# Patient Record
Sex: Female | Born: 1990
Health system: Southern US, Community
[De-identification: ages and names within clinical notes are randomized; demographics above are authoritative.]

## PROBLEM LIST (undated history)

## (undated) ENCOUNTER — Inpatient Hospital Stay (HOSPITAL_COMMUNITY): Payer: Self-pay

## (undated) DIAGNOSIS — L309 Dermatitis, unspecified: Secondary | ICD-10-CM

## (undated) HISTORY — DX: Dermatitis, unspecified: L30.9

## (undated) HISTORY — PX: NO PAST SURGERIES: SHX2092

---
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
## 2011-05-19 LAB — STREP A DNA PROBE: Group A Strep Probe: NEGATIVE

## 2011-05-19 LAB — RAPID STREP SCREEN (MED CTR MEBANE ONLY): Streptococcus, Group A Screen (Direct): NEGATIVE

## 2012-08-15 ENCOUNTER — Emergency Department (HOSPITAL_COMMUNITY)
Admission: EM | Admit: 2012-08-15 | Discharge: 2012-08-15 | Disposition: A | Discharge status: Home / Self Care | Payer: BC Managed Care – PPO | Attending: Emergency Medicine | Admitting: Emergency Medicine

## 2012-08-15 ENCOUNTER — Encounter (HOSPITAL_COMMUNITY): Payer: Self-pay | Admitting: Family Medicine

## 2012-08-15 DIAGNOSIS — Z3490 Encounter for supervision of normal pregnancy, unspecified, unspecified trimester: Secondary | ICD-10-CM

## 2012-08-15 DIAGNOSIS — M549 Dorsalgia, unspecified: Secondary | ICD-10-CM | POA: Insufficient documentation

## 2012-08-15 DIAGNOSIS — Z3201 Encounter for pregnancy test, result positive: Secondary | ICD-10-CM | POA: Insufficient documentation

## 2012-08-15 LAB — URINALYSIS, ROUTINE W REFLEX MICROSCOPIC
Bilirubin Urine: NEGATIVE
Glucose, UA: NEGATIVE mg/dL
Hgb urine dipstick: NEGATIVE
Ketones, ur: NEGATIVE mg/dL
Nitrite: NEGATIVE
Protein, ur: NEGATIVE mg/dL
Specific Gravity, Urine: 1.02 (ref 1.005–1.030)
Urobilinogen, UA: 4 mg/dL — ABNORMAL HIGH (ref 0.0–1.0)
pH: 6.5 (ref 5.0–8.0)

## 2012-08-15 LAB — POCT PREGNANCY, URINE: Preg Test, Ur: POSITIVE — AB

## 2012-08-15 NOTE — ED Provider Notes (Signed)
History    This chart was scribed for [employer]. Rubin Payor, MD, MD by Smitty Pluck, ED Scribe. The patient was seen in room [facility] and the patient's care was started at 6:39PM.   CSN: [id number]  Arrival date & time 08/15/12  1549        Chief Complaint  Patient presents with  . Possible Pregnancy    (Consider location/radiation/quality/duration/timing/severity/associated sxs/prior treatment) The history is provided by the patient. No language interpreter was used.   Shelia Martin is a 21 y.o. female who presents to the Emergency Department due to missing her period this month. Pt reports that she had period in November and it was only 3 days and abnormally light. She states she has not been sexually active since November. She reports that she has generalized back pain and breast tenderness. She states she has had 2 positive pregnancy tests at home. She denies vaginal bleeding, vaginal discharge, nausea, abdominal pain and any other symptoms.   History reviewed. No pertinent past medical history.  History reviewed. No pertinent past surgical history.  History reviewed. No pertinent family history.  History  Substance Use Topics  . Smoking status: Never Smoker   . Smokeless tobacco: Not on file  . Alcohol Use: No    OB History    Grav Para Term Preterm Abortions TAB SAB Ect Mult Living                  Review of Systems  Constitutional: Negative for fever and chills.  Respiratory: Negative for shortness of breath.   Gastrointestinal: Negative for nausea, vomiting and abdominal pain.  Musculoskeletal: Positive for back pain.  Neurological: Negative for weakness.  All other systems reviewed and are negative.    Allergies  Review of patient's allergies indicates no known allergies.  Home Medications   Current Outpatient Rx  Name  Route  Sig  Dispense  Refill  . HYDROCORTISONE 1 % EX CREA   Topical   Apply 1 application topically 2 (two) times daily as needed.  For itiching           BP 129/65  Pulse 85  Temp 98.7 F (37.1 C)  Resp 18  SpO2 97%  LMP 06/22/2012  Physical Exam  Nursing note and vitals reviewed. Constitutional: She is oriented to person, place, and time. She appears well-developed and well-nourished. No distress.  HENT:  Head: Normocephalic and atraumatic.  Eyes: EOM are normal.  Neck: Neck supple. No tracheal deviation present.  Cardiovascular: Normal rate, regular rhythm and normal heart sounds.   Pulmonary/Chest: Effort normal and breath sounds normal. No respiratory distress.  Abdominal: Soft. She exhibits no distension. There is no tenderness. There is no rebound and no guarding.  Musculoskeletal: Normal range of motion.  Neurological: She is alert and oriented to person, place, and time.  Skin: Skin is warm and dry.  Psychiatric: She has a normal mood and affect. Her behavior is normal.    ED Course  Procedures (including critical care time)   COORDINATION OF CARE: 6:42 PM Discussed ED treatment with pt     Results for orders placed during the hospital encounter of 08/15/12  URINALYSIS, ROUTINE W REFLEX MICROSCOPIC      Component Value Range   Color, Urine YELLOW  YELLOW   APPearance CLOUDY (*) CLEAR   Specific Gravity, Urine 1.020  1.005 - 1.030   pH 6.5  5.0 - 8.0   Glucose, UA NEGATIVE  NEGATIVE mg/dL   Hgb urine  dipstick NEGATIVE  NEGATIVE   Bilirubin Urine NEGATIVE  NEGATIVE   Ketones, ur NEGATIVE  NEGATIVE mg/dL   Protein, ur NEGATIVE  NEGATIVE mg/dL   Urobilinogen, UA 4.0 (*) 0.0 - 1.0 mg/dL   Nitrite NEGATIVE  NEGATIVE   Leukocytes, UA TRACE (*) NEGATIVE  POCT PREGNANCY, URINE      Component Value Range   Preg Test, Ur POSITIVE (*) NEGATIVE  URINE MICROSCOPIC-ADD ON      Component Value Range   Squamous Epithelial / LPF RARE  RARE   WBC, UA 0-2  <3 WBC/hpf   RBC / HPF 0-2  <3 RBC/hpf      No results found.   1. Pregnant       MDM  Patient presents after missing a period  in December. She states her period in November was light. She has a positive pregnancy test here. She has had some progress also. She's had no abdominal pain. I doubt ectopic at this time, however is not ruled out. She'll followup with [facility].      I personally performed the services described in this documentation, which was scribed in my presence. The recorded information has been reviewed and is accurate.     Juliet Rude. Rubin Payor, MD 08/15/12 [phone]

## 2012-08-15 NOTE — ED Notes (Signed)
Per pt sts missed period this month and 2 positive pregnancy tests at home

## 2012-08-18 NOTE — L&D Delivery Note (Signed)
Attestation of Attending Supervision of Advanced Practitioner (CNM/NP): Evaluation and management procedures were performed by the Advanced Practitioner under my supervision and collaboration. I have reviewed the Advanced Practitioner's note and chart, and I agree with the management and plan.  Senon Nixon H. 7:57 AM   

## 2012-08-18 NOTE — L&D Delivery Note (Signed)
Delivery Note At 6:30 AM a viable female was delivered via  (Presentation: LOA), terminal meconium noted.  Infant placed directly on mom's abdomen for bonding/skin to skin. Cord allowed to stop pulsing, and was clamped x 2, and cut by FOB.  APGAR: pending at time of note. Infant w/ spontaneous cry and respiration at birth; weight: not available at time of note, but appears SGA.   Placenta status: Intact, Spontaneous, small.  Cord:  with the following complications: none.   Received 1 dose of ampicillin for gbs pos just prior to birth  Anesthesia:  Epidural Episiotomy: n/a Lacerations: intact Suture Repair: n/a Est. Blood Loss (mL): 200  Mom to postpartum.  Baby to nursery-stable. Placenta to pathology d/t SGA, and small placenta.  Plans to breastfeed, undecided about contraception.   Marge Duncans 04/03/2013, 6:49 AM

## 2012-09-06 ENCOUNTER — Ambulatory Visit: Payer: BC Managed Care – PPO | Admitting: Family Medicine

## 2012-09-17 ENCOUNTER — Ambulatory Visit (INDEPENDENT_AMBULATORY_CARE_PROVIDER_SITE_OTHER): Payer: BC Managed Care – PPO | Admitting: Family Medicine

## 2012-09-17 ENCOUNTER — Encounter: Payer: Self-pay | Admitting: Family Medicine

## 2012-09-17 VITALS — BP 116/70 | HR 70 | Resp 18 | Ht 66.0 in | Wt 134.1 lb

## 2012-09-17 DIAGNOSIS — Z3201 Encounter for pregnancy test, result positive: Secondary | ICD-10-CM

## 2012-09-17 NOTE — Patient Instructions (Signed)
Ultrasound to be done for pregnancy dating - [facility] Keep same date with women's health clinic  Continue multivitamin  I recommend Flu shot/ make sure you get at appointment if not before  F/U as needed

## 2012-09-17 NOTE — Progress Notes (Signed)
  Subjective:    Patient ID: Shelia Martin, female    DOB: May 11, 1991, 22 y.o.   MRN: [id number]  HPI  Patient here to establish care. [facility] 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 She found out she was pregnant a few weeks ago while being seen at the hospital for upper respiratory symptoms. She believes her last period was in November but she has not sure the specific day. She is accompanied today by the baby's father. She's not had an appointment with OB/GYN she called her insurance and was told that she was not cover for prenatal care she is apply for Medicaid and was given a tentative appointment for the women's health clinic in [place] for February 18. She is adopted however she does know that Huntington's disease does run in her family. She is taking prenatal vitamins  Review of Systems  GEN- denies fatigue, fever, weight loss,weakness, recent illness HEENT- denies eye drainage, change in vision, nasal discharge, CVS- denies chest pain, palpitations RESP- denies SOB, cough, wheeze ABD- denies N/V, change in stools, abd pain GU- denies dysuria, hematuria, dribbling, incontinence MSK- denies joint pain, muscle aches, injury Neuro- denies headache, dizziness, syncope, seizure activity      Objective:   Physical Exam GEN- NAD, alert and oriented x3, well groomed  HEENT- PERRL, EOMI, non injected sclera, pink conjunctiva, MMM, oropharynx clear Neck- Supple, no thryomegaly CVS- RRR, no murmur RESP-CTAB ABS-NABS,soft,NT,ND EXT- No edema Pulses- Radial, DP- 2+ Psych-normal affect and mood       Assessment & Plan:

## 2012-09-19 NOTE — Assessment & Plan Note (Addendum)
Specific date of LMP unknown, pt single , Father of child here today but it appears that are not on good standing, will set her up for dating ultrasound, discussed prenatal vitamins and water intake which she is doing, Keep appt with [facility]  Will need flu shot Defer labs to her OB

## 2012-09-22 ENCOUNTER — Other Ambulatory Visit (HOSPITAL_COMMUNITY): Payer: BC Managed Care – PPO

## 2012-09-22 ENCOUNTER — Ambulatory Visit (HOSPITAL_COMMUNITY)
Admission: RE | Admit: 2012-09-22 | Discharge: 2012-09-22 | Disposition: A | Discharge status: Home / Self Care | Payer: BC Managed Care – PPO | Source: Ambulatory Visit | Attending: Family Medicine | Admitting: Family Medicine

## 2012-09-22 DIAGNOSIS — Z3201 Encounter for pregnancy test, result positive: Secondary | ICD-10-CM

## 2012-09-22 DIAGNOSIS — Z3689 Encounter for other specified antenatal screening: Secondary | ICD-10-CM | POA: Insufficient documentation

## 2012-09-23 ENCOUNTER — Other Ambulatory Visit: Payer: Self-pay | Admitting: Family Medicine

## 2012-09-23 MED ORDER — HYDROCORTISONE 1 % EX CREA: 1.0000 "application " | TOPICAL_CREAM | Freq: Two times a day (BID) | CUTANEOUS | Status: DC | PRN

## 2012-10-05 ENCOUNTER — Other Ambulatory Visit: Payer: Self-pay | Admitting: Obstetrics & Gynecology

## 2012-10-05 ENCOUNTER — Encounter: Payer: Self-pay | Admitting: Obstetrics & Gynecology

## 2012-10-05 ENCOUNTER — Ambulatory Visit (INDEPENDENT_AMBULATORY_CARE_PROVIDER_SITE_OTHER): Payer: BC Managed Care – PPO | Admitting: Obstetrics & Gynecology

## 2012-10-05 VITALS — BP 93/62 | Temp 97.1°F | Wt 132.2 lb

## 2012-10-05 DIAGNOSIS — Z3400 Encounter for supervision of normal first pregnancy, unspecified trimester: Secondary | ICD-10-CM

## 2012-10-05 DIAGNOSIS — Z23 Encounter for immunization: Secondary | ICD-10-CM

## 2012-10-05 LAB — POCT URINALYSIS DIP (DEVICE)
Bilirubin Urine: NEGATIVE
Glucose, UA: NEGATIVE mg/dL
Hgb urine dipstick: NEGATIVE
Ketones, ur: NEGATIVE mg/dL
Leukocytes, UA: NEGATIVE
Nitrite: NEGATIVE

## 2012-10-05 LAB — OB RESULTS CONSOLE GBS: GBS: POSITIVE

## 2012-10-05 LAB — HIV ANTIBODY (ROUTINE TESTING W REFLEX): HIV: NONREACTIVE

## 2012-10-05 MED ORDER — INFLUENZA VIRUS VACC SPLIT PF IM SUSP
0.5000 mL | Freq: Once | INTRAMUSCULAR | Status: AC
  Administered 2012-10-05: 0.5 mL via INTRAMUSCULAR

## 2012-10-05 NOTE — Patient Instructions (Addendum)
Prenatal Care   WHAT IS PRENATAL CARE?   Prenatal care means health care during your pregnancy, before your baby is born. Take care of yourself and your baby by:   · Getting early prenatal care. If you know you are pregnant, or think you might be pregnant, call your caregiver as soon as possible. Schedule a visit for a general/prenatal examination.  · Getting regular prenatal care. Follow your caregiver's schedule for blood and other necessary tests. Do not miss appointments.  · Do everything you can to keep yourself and your baby healthy during your pregnancy.  · Prenatal care should include evaluation of medical, dietary, educational, psychological, and social needs for the couple and the medical, surgical, and genetic history of the family of the mother and father.  · Discuss with your caregiver:  · Your medicines, prescription, over-the-counter, and herbal medicines.  · Substance abuse, alcohol, smoking, and illegal drugs.  · Domestic abuse and violence, if present.  · Your immunizations.  · Nutrition and diet.  · Exercising.  · Environment and occupational hazards, at home and at work.  · History of sexually transmitted disease, both you and your partner.  · Previous pregnancies.  WHY IS PRENATAL CARE SO IMPORTANT?   By seeing you regularly, your caregiver has the chance to find problems early, so that they can be treated as soon as possible. Other problems might be prevented. Many studies have shown that early and regular prenatal care is important for the health of both mothers and their babies.   I AM THINKING ABOUT GETTING PREGNANT. HOW CAN I TAKE CARE OF MYSELF?   Taking care of yourself before you get pregnant helps you to have a healthy pregnancy. It also lowers your chances of having a baby born with a birth defect. Here are ways to take care of yourself before you get pregnant:   · Eat healthy foods, exercise regularly (30 minutes per day for most days of the week is best), and get enough rest and  sleep. Talk to your caregiver about what kinds of foods and exercises are best for you.  · Take 400 micrograms (mcg) of folic acid (one of the B vitamins) every day. The best way to do this is to take a daily multivitamin pill that contains this amount of folic acid. Getting enough of the synthetic (manufactured) form of folic acid every day before you get pregnant and during early pregnancy can help prevent certain birth defects. Many breakfast cereals and other grain products have folic acid added to them, but only certain cereals contain 400 mcg of folic acid per serving. Check the label on your multivitamin or cereal to find the amount of folic acid in the food.  · See your caregiver for a complete check up before getting pregnant. Make sure that you have had all your immunization shots, especially for rubella (German measles). Rubella can cause serious birth defects. Chickenpox is another illness you want to avoid during pregnancy. If you have had chickenpox and rubella in the past, you should be immune to them.  · Tell your caregiver about any prescription or non-prescription medicines (including herbal remedies) you are taking. Some medicines are not safe to take during pregnancy.  · Stop smoking cigarettes, drinking alcohol, or taking illegal drugs. Ask your caregiver for help, if you need it. You can also get help with alcohol and drugs by talking with a member of your faith community, a counselor, or a trusted friend.  · Discuss   and treat any medical, social, or psychological problems before getting pregnant.  · Discuss any history of genetic problems in the mother, father, and their families. Do genetic testing before getting pregnant, when possible.  · Discuss any physical or emotional abuse with your caregiver.  · Discuss with your caregiver if you might be exposed to harmful chemicals on your job or where you live.  · Discuss with your caregiver if you think your job or the hours you work may be  harmful and should be changed.  · The father should be involved with the decision making and with all aspects of the pregnancy, labor, and delivery.  · If you have medical insurance, make sure you are covered for pregnancy.  I JUST FOUND OUT THAT I AM PREGNANT. HOW CAN I TAKE CARE OF MYSELF?   Here are ways to take care of yourself and the precious new life growing inside you:   · Continue taking your multivitamin with 400 micrograms (mcg) of folic acid every day.  · Get early and regular prenatal care. It does not matter if this is your first pregnancy or if you already have children. It is very important to see a caregiver during your pregnancy. Your caregiver will check at each visit to make sure that you and the baby are healthy. If there are any problems, action can be taken right away to help you and the baby.  · Eat a healthy diet that includes:  · Fruits.  · Vegetables.  · Foods low in saturated fat.  · Grains.  · Calcium-rich foods.  · Drink 6 to 8 glasses of liquids a day.  · Unless your caregiver tells you not to, try to be physically active for 30 minutes, most days of the week. If you are pressed for time, you can get your activity in through 10 minute segments, three times a day.  · If you smoke, drink alcohol, or use drugs, STOP. These can cause long-term damage to your baby. Talk with your caregiver about steps to take to stop smoking. Talk with a member of your faith community, a counselor, a trusted friend, or your caregiver if you are concerned about your alcohol or drug use.  · Ask your caregiver before taking any medicine, even over-the-counter medicines. Some medicines are not safe to take during pregnancy.  · Get plenty of rest and sleep.  · Avoid hot tubs and saunas during pregnancy.  · Do not have X-rays taken, unless absolutely necessary and with the recommendation of your caregiver. A lead shield can be placed on your abdomen, to protect the baby when X-rays are taken in other parts of the  body.  · Do not empty the cat litter when you are pregnant. It may contain a parasite that causes an infection called toxoplasmosis, which can cause birth defects. Also, use gloves when working in garden areas used by cats.  · Do not eat uncooked or undercooked cheese, meats, or fish.  · Stay away from toxic chemicals like:  · Insecticides.  · Solvents (some cleaners or paint thinners).  · Lead.  · Mercury.  · Sexual relations may continue until the end of the pregnancy, unless you have a medical problem or there is a problem with the pregnancy and your caregiver tells you not to.  · Do not wear high heel shoes, especially during the second half of the pregnancy. You can lose your balance and fall.  · Do not take long trips, unless   absolutely necessary. Be sure to see your caregiver before going on the trip.  · Do not sit in one position for more than 2 hours, when on a trip.  · Take a copy of your medical records when going on a trip.  · Know where there is a hospital in the city you are visiting, in case of an emergency.  · Most dangerous household products will have pregnancy warnings on their labels. Ask your caregiver about products if you are unsure.  · Limit or eliminate your caffeine intake from coffee, tea, sodas, medicines, and chocolate.  · Many women continue working through pregnancy. Staying active might help you stay healthier. If you have a question about the safety or the hours you work at your particular job, talk with your caregiver.  · Get informed:  · Read books.  · Watch videos.  · Go to childbirth classes for you and the father.  · Talk with experienced moms.  · Ask your caregiver about childbirth education classes for you and your partner. Classes can help you and your partner prepare for the birth of your baby.  · Ask about a pediatrician (baby doctor) and methods and pain medicine for labor, delivery, and possible Cesarean delivery (C-section).  I AM NOT THINKING ABOUT GETTING PREGNANT  RIGHT NOW, BUT HEARD THAT ALL WOMEN SHOULD TAKE FOLIC ACID EVERY DAY?   All women of childbearing age, with even a remote chance of getting pregnant, should try to make sure they get enough folic acid. Many pregnancies are not planned. Many women do not know they are actually pregnant early in their pregnancies, and certain birth defects happen in the very early part of pregnancy. Taking 400 micrograms (mcg) of folic acid every day will help prevent certain birth defects that happen in the early part of pregnancy. If a woman begins taking vitamin pills in the second or third month of pregnancy, it may be too late to prevent birth defects. Folic acid may also have other health benefits for women, besides preventing birth defects.   HOW OFTEN SHOULD I SEE MY CAREGIVER DURING PREGNANCY?   Your caregiver will give you a schedule for your prenatal visits. You will have visits more often as you get closer to the end of your pregnancy. An average pregnancy lasts about 40 weeks.   A typical schedule includes visiting your caregiver:   · About once each month, during your first 6 months of pregnancy.  · Every 2 weeks, during the next 2 months.  · Weekly in the last month, until the delivery date.  Your caregiver will probably want to see you more often if:  · You are over 35.  · Your pregnancy is high risk, because you have certain health problems or problems with the pregnancy, such as:  · Diabetes.  · High blood pressure.  · The baby is not growing on schedule, according to the dates of the pregnancy.  Your caregiver will do special tests, to make sure you and the baby are not having any serious problems.  WHAT HAPPENS DURING PRENATAL VISITS?   · At your first prenatal visit, your caregiver will talk to you about you and your partner's health history and your family's health history, and will do a physical exam.  · On your first visit, a physical exam will include checks of your blood pressure, height and weight, and an  exam of your pelvic organs. Your caregiver will do a Pap test if you have   not had one recently, and will do cultures of your cervix to make sure there is no infection.  · At each visit, there will be tests of your blood, urine, blood pressure, weight, and checking the progress of the baby.  · Your caregiver will be able to tell you when to expect that your baby will be born.  · Each visit is also a chance for you to learn about staying healthy during pregnancy and for asking questions.  · Discuss whether you will be breastfeeding.  · At your later prenatal visits, your caregiver will check how you are doing and how the baby is developing. You may have a number of tests done as your pregnancy progresses.  · Ultrasound exams are often used to check on the baby's growth and health.  · You may have more urine and blood tests, as well as special tests, if needed. These may include amniocentesis (examine fluid in the pregnancy sac), stress tests (check how baby responds to contractions), biophysical profile (measures fetus well-being). Your caregiver will explain the tests and why they are necessary.  I AM IN MY LATE THIRTIES, AND I WANT TO HAVE A CHILD NOW. SHOULD I DO ANYTHING SPECIAL?   As you get older, there is more chance of having a medical problem (high blood pressure), pregnancy problem (preeclampsia, problems with the placenta), miscarriage, or a baby born with a birth defect. However, most women in their late thirties and early forties have healthy babies. See your caregiver on a regular basis before you get pregnant and be sure to go for exams throughout your pregnancy. Your caregiver probably will want to do some special tests to check on you and your baby's health when you are pregnant.   Women today are often delaying having children until later in life, when they are in their thirties and forties. While many women in their thirties and forties have no difficulty getting pregnant, fertility does decline  with age. For women over 40 who cannot get pregnant after 6 months of trying, it is recommended that they see their caregiver for a fertility evaluation. It is not uncommon to have trouble becoming pregnant or experience infertility (inability to become pregnant after trying for one year). If you think that you or your partner may be infertile, you can discuss this with your caregiver. He or she can recommend treatments such as drugs, surgery, or assisted reproductive technology.   Document Released: 08/07/2003 Document Revised: 10/27/2011 Document Reviewed: 07/04/2009  [employer]® Patient Information ©2013 [employer].

## 2012-10-05 NOTE — Progress Notes (Signed)
New OB applied for Medicaid. No problems, will schedule detailed [place] in 4 weeks. Quad screen in 2 weeks  Subjective: No problems, New OB    Shelia Martin is a G1P0 [redacted]w[redacted]d being seen today for her first obstetrical visit. Patient does intend to breast feed. Pregnancy history fully reviewed.  Patient reports no bleeding and no cramping.  Filed Vitals:   10/05/12 1003  BP: 93/62  Temp: 97.1 F (36.2 C)  Weight: 132 lb 3.2 oz (59.966 kg)    HISTORY: OB History   Grav Para Term Preterm Abortions TAB SAB Ect Mult Living   1              # Outc Date GA Lbr Len/2nd Wgt Sex Del Anes PTL Lv   1 CUR              No past medical history on file. No past surgical history on file. Family History  Problem Relation Age of Onset  . Diabetes Mother   . Diabetes Father      Exam    Uterus:  Fundal Height: 14 cm  Pelvic Exam:    Perineum: No Hemorrhoids   Vulva: normal   Vagina:  normal mucosa   pH:    Cervix: no lesions and STD screening, no Pap   Adnexa: normal adnexa and no mass, fullness, tenderness   Bony Pelvis: average  System: Breast:  normal appearance, no masses or tenderness, No axillary or supraclavicular adenopathy   Skin: normal coloration and turgor, no rashes    Neurologic: oriented, normal mood, grossly non-focal   Extremities: normal strength, tone, and muscle mass   HEENT sclera clear, anicteric, oropharynx clear, no lesions, neck supple with midline trachea and thyroid without masses   Mouth/Teeth mucous membranes moist, pharynx normal without lesions and dental hygiene good   Neck supple and no masses   Cardiovascular: regular rate and rhythm, no murmurs or gallops   Respiratory:  appears well, vitals normal, no respiratory distress, acyanotic, normal RR, chest clear, no wheezing, crepitations, rhonchi, normal symmetric air entry   Abdomen: soft, non-tender; bowel sounds normal; no masses,  no organomegaly   Urinary: urethral meatus normal       Assessment:    Pregnancy: G1P0 Patient Active Problem List  Diagnosis  . Positive pregnancy test  . Supervision of normal first pregnancy        Plan:     Initial labs drawn. Prenatal vitamins. Problem list reviewed and updated. Genetic Screening discussed Quad Screen: ordered in 2 weeks  Ultrasound discussed; fetal survey: ordered  At 18 weeks  Follow up in 4 weeks. 50% of 30 min visit spent on counseling and coordination of care.     ARNOLD,JAMES 10/05/2012

## 2012-10-06 LAB — OBSTETRIC PANEL
Antibody Screen: NEGATIVE
Basophils Absolute: 0 10*3/uL (ref 0.0–0.1)
Basophils Relative: 0 % (ref 0–1)
Eosinophils Absolute: 0.2 10*3/uL (ref 0.0–0.7)
Eosinophils Relative: 3 % (ref 0–5)
HCT: 31.7 % — ABNORMAL LOW (ref 36.0–46.0)
Hemoglobin: 10.9 g/dL — ABNORMAL LOW (ref 12.0–15.0)
MCH: 27.9 pg (ref 26.0–34.0)
MCHC: 34.4 g/dL (ref 30.0–36.0)
MCV: 81.1 fL (ref 78.0–100.0)
Monocytes Absolute: 0.3 10*3/uL (ref 0.1–1.0)
Monocytes Relative: 7 % (ref 3–12)
Neutro Abs: 2.5 10*3/uL (ref 1.7–7.7)
RDW: 15.3 % (ref 11.5–15.5)
Rh Type: POSITIVE

## 2012-10-06 LAB — GLUCOSE TOLERANCE, 1 HOUR (50G) W/O FASTING: Glucose, 1 Hour GTT: 61 mg/dL — ABNORMAL LOW (ref 70–140)

## 2012-10-07 ENCOUNTER — Encounter: Payer: Self-pay | Admitting: Obstetrics & Gynecology

## 2012-10-07 LAB — HEMOGLOBINOPATHY EVALUATION: Hgb A2 Quant: 3.6 % — ABNORMAL HIGH (ref 2.2–3.2)

## 2012-10-08 ENCOUNTER — Telehealth: Payer: Self-pay | Admitting: General Practice

## 2012-10-08 DIAGNOSIS — A491 Streptococcal infection, unspecified site: Secondary | ICD-10-CM

## 2012-10-08 NOTE — Telephone Encounter (Signed)
Message copied by Kathee Delton on Fri Oct 08, 2012  8:55 AM ------      Message from: Adam Phenix      Created: Thu Oct 07, 2012 10:34 PM       GBS positive urine Rx with ampicillin 500 mg TID 7 days ------

## 2012-10-08 NOTE — Telephone Encounter (Signed)
Called patient no answer- left message stating that we are calling to let her know that from her most recent urine culture it looks like she has a urinary tract infection and that we need to call in a Rx to treat this but there is no pharmacy listed in our computer and to please give [place] a call back and let us know what pharmacy she would like [place] to use.

## 2012-10-11 MED ORDER — AMPICILLIN 500 MG PO CAPS: 500.0000 mg | ORAL_CAPSULE | Freq: Three times a day (TID) | ORAL | Status: DC

## 2012-10-11 NOTE — Telephone Encounter (Signed)
Spoke to patient and notified of GBS positive result and Rx Ampicillin sent to [facility] at [address]. Patient agrees and satisfied.

## 2012-10-19 ENCOUNTER — Other Ambulatory Visit: Payer: BC Managed Care – PPO

## 2012-10-20 ENCOUNTER — Other Ambulatory Visit: Payer: BC Managed Care – PPO

## 2012-10-25 ENCOUNTER — Telehealth: Payer: Self-pay | Admitting: Family Medicine

## 2012-10-25 MED ORDER — TRIAMCINOLONE ACETONIDE 0.1 % EX CREA: TOPICAL_CREAM | Freq: Two times a day (BID) | CUTANEOUS | Status: DC

## 2012-11-01 ENCOUNTER — Ambulatory Visit (HOSPITAL_COMMUNITY)
Admission: RE | Admit: 2012-11-01 | Discharge: 2012-11-01 | Disposition: A | Discharge status: Home / Self Care | Payer: BC Managed Care – PPO | Source: Ambulatory Visit | Attending: Obstetrics & Gynecology | Admitting: Obstetrics & Gynecology

## 2012-11-01 DIAGNOSIS — O358XX0 Maternal care for other (suspected) fetal abnormality and damage, not applicable or unspecified: Secondary | ICD-10-CM | POA: Insufficient documentation

## 2012-11-01 DIAGNOSIS — Z1389 Encounter for screening for other disorder: Secondary | ICD-10-CM | POA: Insufficient documentation

## 2012-11-01 DIAGNOSIS — Z3400 Encounter for supervision of normal first pregnancy, unspecified trimester: Secondary | ICD-10-CM

## 2012-11-01 DIAGNOSIS — Z363 Encounter for antenatal screening for malformations: Secondary | ICD-10-CM | POA: Insufficient documentation

## 2012-11-02 ENCOUNTER — Encounter: Payer: BC Managed Care – PPO | Admitting: Obstetrics & Gynecology

## 2012-11-09 ENCOUNTER — Ambulatory Visit (INDEPENDENT_AMBULATORY_CARE_PROVIDER_SITE_OTHER): Payer: BC Managed Care – PPO | Admitting: Obstetrics & Gynecology

## 2012-11-09 ENCOUNTER — Encounter: Payer: Self-pay | Admitting: Obstetrics & Gynecology

## 2012-11-09 ENCOUNTER — Other Ambulatory Visit: Payer: Self-pay | Admitting: Obstetrics & Gynecology

## 2012-11-09 VITALS — BP 98/61 | Wt 134.0 lb

## 2012-11-09 DIAGNOSIS — Z3400 Encounter for supervision of normal first pregnancy, unspecified trimester: Secondary | ICD-10-CM

## 2012-11-09 LAB — POCT URINALYSIS DIP (DEVICE)
Bilirubin Urine: NEGATIVE
Ketones, ur: NEGATIVE mg/dL
Leukocytes, UA: NEGATIVE
Specific Gravity, Urine: 1.02 (ref 1.005–1.030)
pH: 6.5 (ref 5.0–8.0)

## 2012-11-09 NOTE — Progress Notes (Signed)
Routine OB. Denies VB, ROM, or CTXs. Feels some FM.

## 2012-11-09 NOTE — Progress Notes (Signed)
Pulse: 75

## 2012-11-10 ENCOUNTER — Encounter: Payer: Self-pay | Admitting: *Deleted

## 2012-12-07 ENCOUNTER — Encounter: Payer: Self-pay | Admitting: Obstetrics & Gynecology

## 2012-12-07 ENCOUNTER — Ambulatory Visit (INDEPENDENT_AMBULATORY_CARE_PROVIDER_SITE_OTHER): Payer: BC Managed Care – PPO | Admitting: Obstetrics & Gynecology

## 2012-12-07 VITALS — BP 113/78 | Temp 97.8°F | Wt 141.9 lb

## 2012-12-07 DIAGNOSIS — Z3400 Encounter for supervision of normal first pregnancy, unspecified trimester: Secondary | ICD-10-CM

## 2012-12-07 DIAGNOSIS — M549 Dorsalgia, unspecified: Secondary | ICD-10-CM | POA: Insufficient documentation

## 2012-12-07 DIAGNOSIS — O9989 Other specified diseases and conditions complicating pregnancy, childbirth and the puerperium: Secondary | ICD-10-CM

## 2012-12-07 LAB — POCT URINALYSIS DIP (DEVICE)
Glucose, UA: NEGATIVE mg/dL
Hgb urine dipstick: NEGATIVE
Ketones, ur: NEGATIVE mg/dL
Specific Gravity, Urine: 1.015 (ref 1.005–1.030)

## 2012-12-07 NOTE — Patient Instructions (Addendum)
Back Pain in Pregnancy Back pain during pregnancy is common. It happens in about half of all pregnancies. It is important for you and your baby that you remain active during your pregnancy.If you feel that back pain is not allowing you to remain active or sleep well, it is time to see your caregiver. Back pain may be caused by several factors related to changes during your pregnancy.Fortunately, unless you had trouble with your back before your pregnancy, the pain is likely to get better after you deliver. Low back pain usually occurs between the fifth and seventh months of pregnancy. It can, however, happen in the first couple months. Factors that increase the risk of back problems include:   Previous back problems.  Injury to your back.  Having twins or multiple births.  A chronic cough.  Stress.  Job-related repetitive motions.  Muscle or spinal disease in the back.  Family history of back problems, ruptured (herniated) discs, or osteoporosis.  Depression, anxiety, and panic attacks. CAUSES   When you are pregnant, your body produces a hormone called relaxin. This hormonemakes the ligaments connecting the low back and pubic bones more flexible. This flexibility allows the baby to be delivered more easily. When your ligaments are loose, your muscles need to work harder to support your back. Soreness in your back can come from tired muscles. Soreness can also come from back tissues that are irritated since they are receiving less support.  As the baby grows, it puts pressure on the nerves and blood vessels in your pelvis. This can cause back pain.  As the baby grows and gets heavier during pregnancy, the uterus pushes the stomach muscles forward and changes your center of gravity. This makes your back muscles work harder to maintain good posture. SYMPTOMS  Lumbar pain during pregnancy Lumbar pain during pregnancy usually occurs at or above the waist in the center of the back. There  may be pain and numbness that radiates into your leg or foot. This is similar to low back pain experienced by non-pregnant women. It usually increases with sitting for long periods of time, standing, or repetitive lifting. Tenderness may also be present in the muscles along your upper back. Posterior pelvic pain during pregnancy Pain in the back of the pelvis is more common than lumbar pain in pregnancy. It is a deep pain felt in your side at the waistline, or across the tailbone (sacrum), or in both places. You may have pain on one or both sides. This pain can also go into the buttocks and backs of the upper thighs. Pubic and groin pain may also be present. The pain does not quickly resolve with rest, and morning stiffness may also be present. Pelvic pain during pregnancy can be brought on by most activities. A high level of fitness before and during pregnancy may or may not prevent this problem. Labor pain is usually 1 to 2 minutes apart, lasts for about 1 minute, and involves a bearing down feeling or pressure in your pelvis. However, if you are at term with the pregnancy, constant low back pain can be the beginning of early labor, and you should be aware of this. DIAGNOSIS  X-rays of the back should not be done during the first 12 to 14 weeks of the pregnancy and only when absolutely necessary during the rest of the pregnancy. MRIs do not give off radiation and are safe during pregnancy. MRIs also should only be done when absolutely necessary. HOME CARE INSTRUCTIONS  Exercise   as directed by your caregiver. Exercise is the most effective way to prevent or manage back pain. If you have a back problem, it is especially important to avoid sports that require sudden body movements. Swimming and walking are great activities.  Do not stand in one place for long periods of time.  Do not wear high heels.  Sit in chairs with good posture. Use a pillow on your lower back if necessary. Make sure your head  rests over your shoulders and is not hanging forward.  Try sleeping on your side, preferably the left side, with a pillow or two between your legs. If you are sore after a night's rest, your bedmay betoo soft.Try placing a board between your mattress and box spring.  Listen to your body when lifting.If you are experiencing pain, ask for help or try bending yourknees more so you can use your leg muscles rather than your back muscles. Squat down when picking up something from the floor. Do not bend over.  Eat a healthy diet. Try to gain weight within your caregiver's recommendations.  Use heat or cold packs 3 to 4 times a day for 15 minutes to help with the pain.  Only take over-the-counter or prescription medicines for pain, discomfort, or fever as directed by your caregiver. Sudden (acute) back pain  Use bed rest for only the most extreme, acute episodes of back pain. Prolonged bed rest over 48 hours will aggravate your condition.  Ice is very effective for acute conditions.  Put ice in a plastic bag.  Place a towel between your skin and the bag.  Leave the ice on for 10 to 20 minutes every 2 hours, or as needed.  Using heat packs for 30 minutes prior to activities is also helpful. Continued back pain See your caregiver if you have continued problems. Your caregiver can help or refer you for appropriate physical therapy. With conditioning, most back problems can be avoided. Sometimes, a more serious issue may be the cause of back pain. You should be seen right away if new problems seem to be developing. Your caregiver may recommend:  A maternity girdle.  An elastic sling.  A back brace.  A massage therapist or acupuncture. SEEK MEDICAL CARE IF:   You are not able to do most of your daily activities, even when taking the pain medicine you were given.  You need a referral to a physical therapist or chiropractor.  You want to try acupuncture. SEEK IMMEDIATE MEDICAL CARE  IF:  You develop numbness, tingling, weakness, or problems with the use of your arms or legs.  You develop severe back pain that is no longer relieved with medicines.  You have a sudden change in bowel or bladder control.  You have increasing pain in other areas of the body.  You develop shortness of breath, dizziness, or fainting.  You develop nausea, vomiting, or sweating.  You have back pain which is similar to labor pains.  You have back pain along with your water breaking or vaginal bleeding.  You have back pain or numbness that travels down your leg.  Your back pain developed after you fell.  You develop pain on one side of your back. You may have a kidney stone.  You see blood in your urine. You may have a bladder infection or kidney stone.  You have back pain with blisters. You may have shingles. Back pain is fairly common during pregnancy but should not be accepted as just part of   the process. Back pain should always be treated as soon as possible. This will make your pregnancy as pleasant as possible. Document Released: 11/12/2005 Document Revised: 10/27/2011 Document Reviewed: 12/24/2010 [facility] Patient Information 2013 [place], [place]. Pregnancy - Second Trimester The second trimester of pregnancy (3 to 6 months) is a period of rapid growth for you and your baby. At the end of the sixth month, your baby is about 9 inches long and weighs 1 1/2 pounds. You will begin to feel the baby move between 18 and 20 weeks of the pregnancy. This is called quickening. Weight gain is faster. A clear fluid (colostrum) may leak out of your breasts. You may feel small contractions of the womb (uterus). This is known as false labor or Braxton-Hicks contractions. This is like a practice for labor when the baby is ready to be born. Usually, the problems with morning sickness have usually passed by the end of your first trimester. Some women develop small dark blotches (called cholasma, mask of  pregnancy) on their face that usually goes away after the baby is born. Exposure to the sun makes the blotches worse. Acne may also develop in some pregnant women and pregnant women who have acne, may find that it goes away. PRENATAL EXAMS  Blood work may continue to be done during prenatal exams. These tests are done to check on your health and the probable health of your baby. Blood work is used to follow your blood levels (hemoglobin). Anemia (low hemoglobin) is common during pregnancy. Iron and vitamins are given to help prevent this. You will also be checked for diabetes between 24 and 28 weeks of the pregnancy. Some of the previous blood tests may be repeated.  The size of the uterus is measured during each visit. This is to make sure that the baby is continuing to grow properly according to the dates of the pregnancy.  Your blood pressure is checked every prenatal visit. This is to make sure you are not getting toxemia.  Your urine is checked to make sure you do not have an infection, diabetes or protein in the urine.  Your weight is checked often to make sure gains are happening at the suggested rate. This is to ensure that both you and your baby are growing normally.  Sometimes, an ultrasound is performed to confirm the proper growth and development of the baby. This is a test which bounces harmless sound waves off the baby so your caregiver can more accurately determine due dates. Sometimes, a specialized test is done on the amniotic fluid surrounding the baby. This test is called an amniocentesis. The amniotic fluid is obtained by sticking a needle into the belly (abdomen). This is done to check the chromosomes in instances where there is a concern about possible genetic problems with the baby. It is also sometimes done near the end of pregnancy if an early delivery is required. In this case, it is done to help make sure the baby's lungs are mature enough for the baby to live outside of the  womb. CHANGES OCCURING IN THE SECOND TRIMESTER OF PREGNANCY Your body goes through many changes during pregnancy. They vary from person to person. Talk to your caregiver about changes you notice that you are concerned about.  During the second trimester, you will likely have an increase in your appetite. It is normal to have cravings for certain foods. This varies from person to person and pregnancy to pregnancy.  Your lower abdomen will begin to bulge.  You may have to urinate more often because the uterus and baby are pressing on your bladder. It is also common to get more bladder infections during pregnancy (pain with urination). You can help this by drinking lots of fluids and emptying your bladder before and after intercourse.  You may begin to get stretch marks on your hips, abdomen, and breasts. These are normal changes in the body during pregnancy. There are no exercises or medications to take that prevent this change.  You may begin to develop swollen and bulging veins (varicose veins) in your legs. Wearing support hose, elevating your feet for 15 minutes, 3 to 4 times a day and limiting salt in your diet helps lessen the problem.  Heartburn may develop as the uterus grows and pushes up against the stomach. Antacids recommended by your caregiver helps with this problem. Also, eating smaller meals 4 to 5 times a day helps.  Constipation can be treated with a stool softener or adding bulk to your diet. Drinking lots of fluids, vegetables, fruits, and whole grains are helpful.  Exercising is also helpful. If you have been very active up until your pregnancy, most of these activities can be continued during your pregnancy. If you have been less active, it is helpful to start an exercise program such as walking.  Hemorrhoids (varicose veins in the rectum) may develop at the end of the second trimester. Warm sitz baths and hemorrhoid cream recommended by your caregiver helps hemorrhoid  problems.  Backaches may develop during this time of your pregnancy. Avoid heavy lifting, wear low heal shoes and practice good posture to help with backache problems.  Some pregnant women develop tingling and numbness of their hand and fingers because of swelling and tightening of ligaments in the wrist (carpel tunnel syndrome). This goes away after the baby is born.  As your breasts enlarge, you may have to get a bigger bra. Get a comfortable, cotton, support bra. Do not get a nursing bra until the last month of the pregnancy if you will be nursing the baby.  You may get a dark line from your belly button to the pubic area called the linea nigra.  You may develop rosy cheeks because of increase blood flow to the face.  You may develop spider looking lines of the face, neck, arms and chest. These go away after the baby is born. HOME CARE INSTRUCTIONS   It is extremely important to avoid all smoking, herbs, alcohol, and unprescribed drugs during your pregnancy. These chemicals affect the formation and growth of the baby. Avoid these chemicals throughout the pregnancy to ensure the delivery of a healthy infant.  Most of your home care instructions are the same as suggested for the first trimester of your pregnancy. Keep your caregiver's appointments. Follow your caregiver's instructions regarding medication use, exercise and diet.  During pregnancy, you are providing food for you and your baby. Continue to eat regular, well-balanced meals. Choose foods such as meat, fish, milk and other low fat dairy products, vegetables, fruits, and whole-grain breads and cereals. Your caregiver will tell you of the ideal weight gain.  A physical sexual relationship may be continued up until near the end of pregnancy if there are no other problems. Problems could include early (premature) leaking of amniotic fluid from the membranes, vaginal bleeding, abdominal pain, or other medical or pregnancy  problems.  Exercise regularly if there are no restrictions. Check with your caregiver if you are unsure of the safety of some  of your exercises. The greatest weight gain will occur in the last 2 trimesters of pregnancy. Exercise will help you:  Control your weight.  Get you in shape for labor and delivery.  Lose weight after you have the baby.  Wear a good support or jogging bra for breast tenderness during pregnancy. This may help if worn during sleep. Pads or tissues may be used in the bra if you are leaking colostrum.  Do not use hot tubs, steam rooms or saunas throughout the pregnancy.  Wear your seat belt at all times when driving. This protects you and your baby if you are in an accident.  Avoid raw meat, uncooked cheese, cat litter boxes and soil used by cats. These carry germs that can cause birth defects in the baby.  The second trimester is also a good time to visit your dentist for your dental health if this has not been done yet. Getting your teeth cleaned is OK. Use a soft toothbrush. Brush gently during pregnancy.  It is easier to loose urine during pregnancy. Tightening up and strengthening the pelvic muscles will help with this problem. Practice stopping your urination while you are going to the bathroom. These are the same muscles you need to strengthen. It is also the muscles you would use as if you were trying to stop from passing gas. You can practice tightening these muscles up 10 times a set and repeating this about 3 times per day. Once you know what muscles to tighten up, do not perform these exercises during urination. It is more likely to contribute to an infection by backing up the urine.  Ask for help if you have financial, counseling or nutritional needs during pregnancy. Your caregiver will be able to offer counseling for these needs as well as refer you for other special needs.  Your skin may become oily. If so, wash your face with mild soap, use non-greasy  moisturizer and oil or cream based makeup. MEDICATIONS AND DRUG USE IN PREGNANCY  Take prenatal vitamins as directed. The vitamin should contain 1 milligram of folic acid. Keep all vitamins out of reach of children. Only a couple vitamins or tablets containing iron may be fatal to a baby or young child when ingested.  Avoid use of all medications, including herbs, over-the-counter medications, not prescribed or suggested by your caregiver. Only take over-the-counter or prescription medicines for pain, discomfort, or fever as directed by your caregiver. Do not use aspirin.  Let your caregiver also know about herbs you may be using.  Alcohol is related to a number of birth defects. This includes fetal alcohol syndrome. All alcohol, in any form, should be avoided completely. Smoking will cause low birth rate and premature babies.  Street or illegal drugs are very harmful to the baby. They are absolutely forbidden. A baby born to an addicted mother will be addicted at birth. The baby will go through the same withdrawal an adult does. SEEK MEDICAL CARE IF:  You have any concerns or worries during your pregnancy. It is better to call with your questions if you feel they cannot wait, rather than worry about them. SEEK IMMEDIATE MEDICAL CARE IF:   An unexplained oral temperature above 102 F (38.9 C) develops, or as your caregiver suggests.  You have leaking of fluid from the vagina (birth canal). If leaking membranes are suspected, take your temperature and tell your caregiver of this when you call.  There is vaginal spotting, bleeding, or passing clots. Tell your  caregiver of the amount and how many pads are used. Light spotting in pregnancy is common, especially following intercourse.  You develop a bad smelling vaginal discharge with a change in the color from clear to white.  You continue to feel sick to your stomach (nauseated) and have no relief from remedies suggested. You vomit blood or  coffee ground-like materials.  You lose more than 2 pounds of weight or gain more than 2 pounds of weight over 1 week, or as suggested by your caregiver.  You notice swelling of your face, hands, feet, or legs.  You get exposed to [place] measles and have never had them.  You are exposed to fifth disease or chickenpox.  You develop belly (abdominal) pain. Round ligament discomfort is a common non-cancerous (benign) cause of abdominal pain in pregnancy. Your caregiver still must evaluate you.  You develop a bad headache that does not go away.  You develop fever, diarrhea, pain with urination, or shortness of breath.  You develop visual problems, blurry, or double vision.  You fall or are in a car accident or any kind of trauma.  There is mental or physical violence at home. Document Released: 07/29/2001 Document Revised: 10/27/2011 Document Reviewed: 01/31/2009 [facility] Patient Information 2013 [place], [place]. Contraception Choices Contraception (birth control) is the use of any methods or devices to prevent pregnancy. Below are some methods to help avoid pregnancy. HORMONAL METHODS   Contraceptive implant. This is a thin, plastic tube containing progesterone hormone. It does not contain estrogen hormone. Your caregiver inserts the tube in the inner part of the upper arm. The tube can remain in place for up to 3 years. After 3 years, the implant must be removed. The implant prevents the ovaries from releasing an egg (ovulation), thickens the cervical mucus which prevents sperm from entering the uterus, and thins the lining of the inside of the uterus.  Progesterone-only injections. These injections are given every 3 months by your caregiver to prevent pregnancy. This synthetic progesterone hormone stops the ovaries from releasing eggs. It also thickens cervical mucus and changes the uterine lining. This makes it harder for sperm to survive in the uterus.  Birth control pills. These  pills contain estrogen and progesterone hormone. They work by stopping the egg from forming in the ovary (ovulation). Birth control pills are prescribed by a caregiver.Birth control pills can also be used to treat heavy periods.  Minipill. This type of birth control pill contains only the progesterone hormone. They are taken every day of each month and must be prescribed by your caregiver.  Birth control patch. The patch contains hormones similar to those in birth control pills. It must be changed once a week and is prescribed by a caregiver.  Vaginal ring. The ring contains hormones similar to those in birth control pills. It is left in the vagina for 3 weeks, removed for 1 week, and then a new one is put back in place. The patient must be comfortable inserting and removing the ring from the vagina.A caregiver's prescription is necessary.  Emergency contraception. Emergency contraceptives prevent pregnancy after unprotected sexual intercourse. This pill can be taken right after sex or up to 5 days after unprotected sex. It is most effective the sooner you take the pills after having sexual intercourse. Emergency contraceptive pills are available without a prescription. Check with your pharmacist. Do not use emergency contraception as your only form of birth control. BARRIER METHODS   Female condom. This is a thin sheath (latex  or rubber) that is worn over the penis during sexual intercourse. It can be used with spermicide to increase effectiveness.  Female condom. This is a soft, loose-fitting sheath that is put into the vagina before sexual intercourse.  Diaphragm. This is a soft, latex, dome-shaped barrier that must be fitted by a caregiver. It is inserted into the vagina, along with a spermicidal jelly. It is inserted before intercourse. The diaphragm should be left in the vagina for 6 to 8 hours after intercourse.  Cervical cap. This is a round, soft, latex or plastic cup that fits over the  cervix and must be fitted by a caregiver. The cap can be left in place for up to 48 hours after intercourse.  Sponge. This is a soft, circular piece of polyurethane foam. The sponge has spermicide in it. It is inserted into the vagina after wetting it and before sexual intercourse.  Spermicides. These are chemicals that kill or block sperm from entering the cervix and uterus. They come in the form of creams, jellies, suppositories, foam, or tablets. They do not require a prescription. They are inserted into the vagina with an applicator before having sexual intercourse. The process must be repeated every time you have sexual intercourse. INTRAUTERINE CONTRACEPTION  Intrauterine device (IUD). This is a T-shaped device that is put in a woman's uterus during a menstrual period to prevent pregnancy. There are 2 types:  Copper IUD. This type of IUD is wrapped in copper wire and is placed inside the uterus. Copper makes the uterus and fallopian tubes produce a fluid that kills sperm. It can stay in place for 10 years.  Hormone IUD. This type of IUD contains the hormone progestin (synthetic progesterone). The hormone thickens the cervical mucus and prevents sperm from entering the uterus, and it also thins the uterine lining to prevent implantation of a fertilized egg. The hormone can weaken or kill the sperm that get into the uterus. It can stay in place for 5 years. PERMANENT METHODS OF CONTRACEPTION  Female tubal ligation. This is when the woman's fallopian tubes are surgically sealed, tied, or blocked to prevent the egg from traveling to the uterus.  Female sterilization. This is when the female has the tubes that carry sperm tied off (vasectomy).This blocks sperm from entering the vagina during sexual intercourse. After the procedure, the man can still ejaculate fluid (semen). NATURAL PLANNING METHODS  Natural family planning. This is not having sexual intercourse or using a barrier method (condom,  diaphragm, cervical cap) on days the woman could become pregnant.  Calendar method. This is keeping track of the length of each menstrual cycle and identifying when you are fertile.  Ovulation method. This is avoiding sexual intercourse during ovulation.  Symptothermal method. This is avoiding sexual intercourse during ovulation, using a thermometer and ovulation symptoms.  Post-ovulation method. This is timing sexual intercourse after you have ovulated. Regardless of which type or method of contraception you choose, it is important that you use condoms to protect against the transmission of sexually transmitted diseases (STDs). Talk with your caregiver about which form of contraception is most appropriate for you. Document Released: 08/04/2005 Document Revised: 10/27/2011 Document Reviewed: 12/11/2010 [facility] [facility] Patient Information 2013 [place], [place].

## 2012-12-07 NOTE — Progress Notes (Signed)
Pt c/o low back pain.  Rec lifts in shoes.  Wants to start OCP's- info given    No VB, no ctx, no LOF

## 2012-12-07 NOTE — Progress Notes (Signed)
Pulse- 80  Pain-back

## 2013-01-04 ENCOUNTER — Encounter: Payer: BC Managed Care – PPO | Admitting: Obstetrics & Gynecology

## 2013-01-18 ENCOUNTER — Ambulatory Visit (INDEPENDENT_AMBULATORY_CARE_PROVIDER_SITE_OTHER): Payer: BC Managed Care – PPO | Admitting: Obstetrics & Gynecology

## 2013-01-18 VITALS — BP 103/63 | Temp 97.8°F | Wt 144.9 lb

## 2013-01-18 DIAGNOSIS — Z3400 Encounter for supervision of normal first pregnancy, unspecified trimester: Secondary | ICD-10-CM

## 2013-01-18 DIAGNOSIS — Z3403 Encounter for supervision of normal first pregnancy, third trimester: Secondary | ICD-10-CM

## 2013-01-18 LAB — CBC
HCT: 32.1 % — ABNORMAL LOW (ref 36.0–46.0)
Hemoglobin: 11.2 g/dL — ABNORMAL LOW (ref 12.0–15.0)
MCV: 80.9 fL (ref 78.0–100.0)
Platelets: 161 10*3/uL (ref 150–400)
RBC: 3.97 MIL/uL (ref 3.87–5.11)
WBC: 6.8 10*3/uL (ref 4.0–10.5)

## 2013-01-18 LAB — POCT URINALYSIS DIP (DEVICE)
Bilirubin Urine: NEGATIVE
Glucose, UA: NEGATIVE mg/dL
Hgb urine dipstick: NEGATIVE
Specific Gravity, Urine: >=1.03 (ref 1.005–1.030)

## 2013-01-18 NOTE — Progress Notes (Signed)
Good fetal movement no problems

## 2013-01-18 NOTE — Patient Instructions (Signed)

## 2013-01-18 NOTE — Progress Notes (Signed)
Pulse: 72

## 2013-02-08 ENCOUNTER — Ambulatory Visit (INDEPENDENT_AMBULATORY_CARE_PROVIDER_SITE_OTHER): Payer: BC Managed Care – PPO | Admitting: Obstetrics & Gynecology

## 2013-02-08 VITALS — BP 108/65 | Temp 97.0°F | Wt 145.7 lb

## 2013-02-08 DIAGNOSIS — O9989 Other specified diseases and conditions complicating pregnancy, childbirth and the puerperium: Secondary | ICD-10-CM

## 2013-02-08 DIAGNOSIS — Z3201 Encounter for pregnancy test, result positive: Secondary | ICD-10-CM

## 2013-02-08 DIAGNOSIS — M549 Dorsalgia, unspecified: Secondary | ICD-10-CM

## 2013-02-08 LAB — POCT URINALYSIS DIP (DEVICE)
Bilirubin Urine: NEGATIVE
Glucose, UA: NEGATIVE mg/dL
Hgb urine dipstick: NEGATIVE
Ketones, ur: NEGATIVE mg/dL
Nitrite: NEGATIVE
Specific Gravity, Urine: 1.02 (ref 1.005–1.030)

## 2013-02-08 NOTE — Progress Notes (Signed)
Routine visit. Good Fm. No problems.

## 2013-02-08 NOTE — Progress Notes (Signed)
Pulse- 80  Edema-feet

## 2013-02-22 ENCOUNTER — Ambulatory Visit (INDEPENDENT_AMBULATORY_CARE_PROVIDER_SITE_OTHER): Payer: BC Managed Care – PPO | Admitting: Obstetrics & Gynecology

## 2013-02-22 VITALS — BP 112/63 | Temp 97.0°F | Wt 149.0 lb

## 2013-02-22 DIAGNOSIS — M549 Dorsalgia, unspecified: Secondary | ICD-10-CM

## 2013-02-22 DIAGNOSIS — O26899 Other specified pregnancy related conditions, unspecified trimester: Secondary | ICD-10-CM

## 2013-02-22 DIAGNOSIS — O9989 Other specified diseases and conditions complicating pregnancy, childbirth and the puerperium: Secondary | ICD-10-CM

## 2013-02-22 LAB — POCT URINALYSIS DIP (DEVICE)
Bilirubin Urine: NEGATIVE
Ketones, ur: NEGATIVE mg/dL
pH: 6 (ref 5.0–8.0)

## 2013-02-22 NOTE — Progress Notes (Signed)
Pulse- 78  Edema-ankles  Pain/pressure-right side

## 2013-02-22 NOTE — Patient Instructions (Signed)

## 2013-02-22 NOTE — Progress Notes (Signed)
No problems 

## 2013-03-08 ENCOUNTER — Ambulatory Visit (INDEPENDENT_AMBULATORY_CARE_PROVIDER_SITE_OTHER): Payer: BC Managed Care – PPO | Admitting: Obstetrics and Gynecology

## 2013-03-08 ENCOUNTER — Encounter: Payer: Self-pay | Admitting: Obstetrics and Gynecology

## 2013-03-08 VITALS — BP 114/65 | Temp 97.9°F | Wt 150.1 lb

## 2013-03-08 DIAGNOSIS — M549 Dorsalgia, unspecified: Secondary | ICD-10-CM

## 2013-03-08 DIAGNOSIS — O2390 Unspecified genitourinary tract infection in pregnancy, unspecified trimester: Secondary | ICD-10-CM

## 2013-03-08 DIAGNOSIS — O9989 Other specified diseases and conditions complicating pregnancy, childbirth and the puerperium: Secondary | ICD-10-CM

## 2013-03-08 DIAGNOSIS — B951 Streptococcus, group B, as the cause of diseases classified elsewhere: Secondary | ICD-10-CM | POA: Insufficient documentation

## 2013-03-08 DIAGNOSIS — O2340 Unspecified infection of urinary tract in pregnancy, unspecified trimester: Secondary | ICD-10-CM | POA: Insufficient documentation

## 2013-03-08 DIAGNOSIS — Z3400 Encounter for supervision of normal first pregnancy, unspecified trimester: Secondary | ICD-10-CM

## 2013-03-08 DIAGNOSIS — Z3403 Encounter for supervision of normal first pregnancy, third trimester: Secondary | ICD-10-CM

## 2013-03-08 LAB — POCT URINALYSIS DIP (DEVICE)
Bilirubin Urine: NEGATIVE
Glucose, UA: NEGATIVE mg/dL
Hgb urine dipstick: NEGATIVE
Leukocytes, UA: NEGATIVE
Nitrite: NEGATIVE

## 2013-03-08 LAB — OB RESULTS CONSOLE GC/CHLAMYDIA: Gonorrhea: NEGATIVE

## 2013-03-08 NOTE — Progress Notes (Signed)
Patient doing well without complaints. FM/PTL precautions reviewed. Patient declined pelvic exam today secondary to irregular contractions. Has not been sexually active throughout pregnancy and opted for urine culture.

## 2013-03-08 NOTE — Progress Notes (Signed)
Pulse- 76  Edema-feet

## 2013-03-15 ENCOUNTER — Encounter: Payer: Self-pay | Admitting: Obstetrics & Gynecology

## 2013-03-15 ENCOUNTER — Ambulatory Visit (INDEPENDENT_AMBULATORY_CARE_PROVIDER_SITE_OTHER): Payer: BC Managed Care – PPO | Admitting: Obstetrics & Gynecology

## 2013-03-15 VITALS — BP 102/68 | Temp 97.4°F | Wt 151.2 lb

## 2013-03-15 DIAGNOSIS — O2390 Unspecified genitourinary tract infection in pregnancy, unspecified trimester: Secondary | ICD-10-CM

## 2013-03-15 DIAGNOSIS — Z3403 Encounter for supervision of normal first pregnancy, third trimester: Secondary | ICD-10-CM

## 2013-03-15 LAB — POCT URINALYSIS DIP (DEVICE)
Bilirubin Urine: NEGATIVE
Ketones, ur: NEGATIVE mg/dL
pH: 7 (ref 5.0–8.0)

## 2013-03-15 NOTE — Progress Notes (Signed)
P-82 

## 2013-03-15 NOTE — Progress Notes (Signed)
Routine visit. Good FM. Labor precautions reviewed. 

## 2013-03-20 ENCOUNTER — Encounter (HOSPITAL_COMMUNITY): Payer: Self-pay

## 2013-03-20 ENCOUNTER — Observation Stay (HOSPITAL_COMMUNITY)
Admission: EM | Admit: 2013-03-20 | Discharge: 2013-03-22 | Disposition: A | Discharge status: Home / Self Care | Payer: BC Managed Care – PPO | Attending: Family Medicine | Admitting: Family Medicine

## 2013-03-20 DIAGNOSIS — O26892 Other specified pregnancy related conditions, second trimester: Secondary | ICD-10-CM

## 2013-03-20 DIAGNOSIS — S336XXA Sprain of sacroiliac joint, initial encounter: Secondary | ICD-10-CM

## 2013-03-20 DIAGNOSIS — M549 Dorsalgia, unspecified: Secondary | ICD-10-CM

## 2013-03-20 DIAGNOSIS — S39012A Strain of muscle, fascia and tendon of lower back, initial encounter: Secondary | ICD-10-CM

## 2013-03-20 DIAGNOSIS — M545 Low back pain, unspecified: Secondary | ICD-10-CM | POA: Insufficient documentation

## 2013-03-20 DIAGNOSIS — O2343 Unspecified infection of urinary tract in pregnancy, third trimester: Secondary | ICD-10-CM

## 2013-03-20 DIAGNOSIS — IMO0002 Reserved for concepts with insufficient information to code with codable children: Secondary | ICD-10-CM

## 2013-03-20 DIAGNOSIS — O99891 Other specified diseases and conditions complicating pregnancy: Principal | ICD-10-CM | POA: Insufficient documentation

## 2013-03-20 DIAGNOSIS — Z22330 Carrier of Group B streptococcus: Secondary | ICD-10-CM | POA: Insufficient documentation

## 2013-03-20 DIAGNOSIS — Y92410 Unspecified street and highway as the place of occurrence of the external cause: Secondary | ICD-10-CM | POA: Insufficient documentation

## 2013-03-20 DIAGNOSIS — B951 Streptococcus, group B, as the cause of diseases classified elsewhere: Secondary | ICD-10-CM

## 2013-03-20 DIAGNOSIS — O479 False labor, unspecified: Secondary | ICD-10-CM

## 2013-03-20 MED ORDER — LACTATED RINGERS IV SOLN
INTRAVENOUS | Status: DC
  Administered 2013-03-21 (×3): via INTRAVENOUS

## 2013-03-20 MED ORDER — LACTATED RINGERS IV BOLUS (SEPSIS)
500.0000 mL | Freq: Once | INTRAVENOUS | Status: AC
  Administered 2013-03-20: 500 mL via INTRAVENOUS
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
## 2013-03-20 NOTE — Progress Notes (Signed)
K.Bee Marchiano,RNC SPOKE WITH CASSY, RN ON ANTENATAL UNIT AT [facility]; GAVE REPORT ON PT TO BE TRANSFERRED FOR 23HR OBS.  PT TO GO TO 154
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
## 2013-03-20 NOTE — ED Notes (Signed)
OB rapid response nurse at bedside. 
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
## 2013-03-21 ENCOUNTER — Encounter (HOSPITAL_COMMUNITY): Payer: Self-pay | Admitting: *Deleted

## 2013-03-21 ENCOUNTER — Observation Stay (HOSPITAL_COMMUNITY): Payer: BC Managed Care – PPO

## 2013-03-21 LAB — TYPE AND SCREEN
ABO/RH(D): O POS
Antibody Screen: NEGATIVE

## 2013-03-21 LAB — CBC WITH DIFFERENTIAL/PLATELET
Basophils Absolute: 0 10*3/uL (ref 0.0–0.1)
Basophils Relative: 0 % (ref 0–1)
MCHC: 34.7 g/dL (ref 30.0–36.0)
Neutro Abs: 5.6 10*3/uL (ref 1.7–7.7)
Neutrophils Relative %: 72 % (ref 43–77)
Platelets: 170 10*3/uL (ref 150–400)
RDW: 13 % (ref 11.5–15.5)

## 2013-03-21 LAB — RPR: RPR Ser Ql: NONREACTIVE

## 2013-03-21 MED ORDER — PRENATAL MULTIVITAMIN CH
1.0000 | ORAL_TABLET | Freq: Every day | ORAL | Status: DC
  Administered 2013-03-21: 1 via ORAL
  Filled 2013-03-21: qty 1

## 2013-03-21 MED ORDER — DOCUSATE SODIUM 100 MG PO CAPS
100.0000 mg | ORAL_CAPSULE | Freq: Every day | ORAL | Status: DC
  Administered 2013-03-21: 100 mg via ORAL
  Filled 2013-03-21: qty 1

## 2013-03-21 MED ORDER — CALCIUM CARBONATE ANTACID 500 MG PO CHEW: 2.0000 | CHEWABLE_TABLET | ORAL | Status: DC | PRN

## 2013-03-21 MED ORDER — ACETAMINOPHEN 325 MG PO TABS: 650.0000 mg | ORAL_TABLET | ORAL | Status: DC | PRN

## 2013-03-21 MED ORDER — ZOLPIDEM TARTRATE 5 MG PO TABS: 5.0000 mg | ORAL_TABLET | Freq: Every evening | ORAL | Status: DC | PRN
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
## 2013-03-21 NOTE — Progress Notes (Signed)
Ur chart review completed.  
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
## 2013-03-22 ENCOUNTER — Encounter: Payer: Self-pay | Admitting: Family Medicine

## 2013-03-22 ENCOUNTER — Ambulatory Visit (INDEPENDENT_AMBULATORY_CARE_PROVIDER_SITE_OTHER): Payer: BC Managed Care – PPO | Admitting: Family Medicine

## 2013-03-22 VITALS — BP 123/67 | Temp 97.9°F | Wt 155.4 lb

## 2013-03-22 DIAGNOSIS — Z3403 Encounter for supervision of normal first pregnancy, third trimester: Secondary | ICD-10-CM

## 2013-03-22 DIAGNOSIS — O2390 Unspecified genitourinary tract infection in pregnancy, unspecified trimester: Secondary | ICD-10-CM

## 2013-03-22 LAB — POCT URINALYSIS DIP (DEVICE)
Leukocytes, UA: NEGATIVE
Nitrite: NEGATIVE
Protein, ur: NEGATIVE mg/dL
Urobilinogen, UA: 0.2 mg/dL (ref 0.0–1.0)
pH: 7.5 (ref 5.0–8.0)

## 2013-03-22 NOTE — Progress Notes (Signed)
Pulse- 66 Patient reports occasional contractions
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
## 2013-03-22 NOTE — Patient Instructions (Signed)
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
## 2013-03-30 ENCOUNTER — Ambulatory Visit (INDEPENDENT_AMBULATORY_CARE_PROVIDER_SITE_OTHER): Payer: BC Managed Care – PPO | Admitting: Advanced Practice Midwife

## 2013-03-30 VITALS — BP 118/71 | Temp 97.2°F | Wt 152.0 lb

## 2013-03-30 DIAGNOSIS — B951 Streptococcus, group B, as the cause of diseases classified elsewhere: Secondary | ICD-10-CM

## 2013-03-30 DIAGNOSIS — O2390 Unspecified genitourinary tract infection in pregnancy, unspecified trimester: Secondary | ICD-10-CM

## 2013-03-30 LAB — POCT URINALYSIS DIP (DEVICE)
Glucose, UA: NEGATIVE mg/dL
Nitrite: NEGATIVE
Protein, ur: NEGATIVE mg/dL
Urobilinogen, UA: 0.2 mg/dL (ref 0.0–1.0)

## 2013-03-30 NOTE — Progress Notes (Signed)
Some UCs when she has been active and on her feet. Baby is moving normally. She desires SVE today.

## 2013-03-30 NOTE — Progress Notes (Signed)
Pulse 75 Edema trace in feet.  

## 2013-04-01 ENCOUNTER — Inpatient Hospital Stay (HOSPITAL_COMMUNITY)
Admission: AD | Admit: 2013-04-01 | Discharge: 2013-04-02 | Disposition: A | Discharge status: Home / Self Care | Payer: BC Managed Care – PPO | Source: Ambulatory Visit | Attending: Obstetrics & Gynecology | Admitting: Obstetrics & Gynecology

## 2013-04-01 ENCOUNTER — Encounter (HOSPITAL_COMMUNITY): Payer: Self-pay | Admitting: *Deleted

## 2013-04-01 ENCOUNTER — Inpatient Hospital Stay (HOSPITAL_COMMUNITY)
Admission: AD | Admit: 2013-04-01 | Discharge: 2013-04-01 | Disposition: A | Discharge status: Home / Self Care | Payer: BC Managed Care – PPO | Source: Ambulatory Visit | Attending: Obstetrics & Gynecology | Admitting: Obstetrics & Gynecology

## 2013-04-01 DIAGNOSIS — M549 Dorsalgia, unspecified: Secondary | ICD-10-CM

## 2013-04-01 DIAGNOSIS — B951 Streptococcus, group B, as the cause of diseases classified elsewhere: Secondary | ICD-10-CM

## 2013-04-01 DIAGNOSIS — O479 False labor, unspecified: Secondary | ICD-10-CM | POA: Insufficient documentation

## 2013-04-01 DIAGNOSIS — O2343 Unspecified infection of urinary tract in pregnancy, third trimester: Secondary | ICD-10-CM

## 2013-04-01 NOTE — MAU Note (Signed)
Pt reports uc's since 5 am. Pt states uc's are 5-6 minutes apart

## 2013-04-01 NOTE — MAU Note (Signed)
Was seen MAU earlier today for contractions. Ctxs now closer and stronger. Denies leaking or bleeding

## 2013-04-01 NOTE — Progress Notes (Signed)
Dr Ike Bene notified of pt's admission and status. Will continue to monitor for reactive FHR and then d/c home with Ambien.

## 2013-04-02 MED ORDER — ZOLPIDEM TARTRATE 5 MG PO TABS
5.0000 mg | ORAL_TABLET | Freq: Once | ORAL | Status: AC
  Administered 2013-04-02: 5 mg via ORAL
  Filled 2013-04-02: qty 1

## 2013-04-02 NOTE — Progress Notes (Signed)
Dr Ike Bene notified of pt's FM strip. Will see pt

## 2013-04-03 ENCOUNTER — Encounter (HOSPITAL_COMMUNITY): Payer: Self-pay | Admitting: *Deleted

## 2013-04-03 ENCOUNTER — Encounter (HOSPITAL_COMMUNITY): Payer: Self-pay | Admitting: Anesthesiology

## 2013-04-03 ENCOUNTER — Inpatient Hospital Stay (HOSPITAL_COMMUNITY): Payer: BC Managed Care – PPO | Admitting: Anesthesiology

## 2013-04-03 ENCOUNTER — Inpatient Hospital Stay (HOSPITAL_COMMUNITY)
Admission: AD | Admit: 2013-04-03 | Discharge: 2013-04-05 | DRG: 373 | Disposition: A | Discharge status: Home / Self Care | Payer: BC Managed Care – PPO | Source: Ambulatory Visit | Attending: Obstetrics & Gynecology | Admitting: Obstetrics & Gynecology

## 2013-04-03 DIAGNOSIS — M549 Dorsalgia, unspecified: Secondary | ICD-10-CM

## 2013-04-03 DIAGNOSIS — O99892 Other specified diseases and conditions complicating childbirth: Secondary | ICD-10-CM

## 2013-04-03 DIAGNOSIS — IMO0001 Reserved for inherently not codable concepts without codable children: Secondary | ICD-10-CM

## 2013-04-03 DIAGNOSIS — O365990 Maternal care for other known or suspected poor fetal growth, unspecified trimester, not applicable or unspecified: Secondary | ICD-10-CM

## 2013-04-03 DIAGNOSIS — O9989 Other specified diseases and conditions complicating pregnancy, childbirth and the puerperium: Secondary | ICD-10-CM

## 2013-04-03 DIAGNOSIS — Z22330 Carrier of Group B streptococcus: Secondary | ICD-10-CM

## 2013-04-03 DIAGNOSIS — O2343 Unspecified infection of urinary tract in pregnancy, third trimester: Secondary | ICD-10-CM

## 2013-04-03 DIAGNOSIS — B951 Streptococcus, group B, as the cause of diseases classified elsewhere: Secondary | ICD-10-CM

## 2013-04-03 LAB — CBC
HCT: 35.8 % — ABNORMAL LOW (ref 36.0–46.0)
MCH: 27.5 pg (ref 26.0–34.0)
MCV: 79.9 fL (ref 78.0–100.0)
Platelets: 198 10*3/uL (ref 150–400)
RBC: 4.48 MIL/uL (ref 3.87–5.11)
RDW: 13.3 % (ref 11.5–15.5)

## 2013-04-03 LAB — TYPE AND SCREEN: ABO/RH(D): O POS

## 2013-04-03 MED ORDER — EPHEDRINE 5 MG/ML INJ
10.0000 mg | INTRAVENOUS | Status: DC | PRN
  Filled 2013-04-03: qty 4
  Filled 2013-04-03: qty 2

## 2013-04-03 MED ORDER — BISACODYL 10 MG RE SUPP: 10.0000 mg | Freq: Every day | RECTAL | Status: DC | PRN

## 2013-04-03 MED ORDER — OXYTOCIN BOLUS FROM INFUSION
500.0000 mL | INTRAVENOUS | Status: DC
  Administered 2013-04-03: 500 mL via INTRAVENOUS

## 2013-04-03 MED ORDER — LIDOCAINE HCL (PF) 1 % IJ SOLN
INTRAMUSCULAR | Status: DC | PRN
  Administered 2013-04-03 (×3): 5 mL

## 2013-04-03 MED ORDER — MEASLES, MUMPS & RUBELLA VAC ~~LOC~~ INJ
0.5000 mL | INJECTION | Freq: Once | SUBCUTANEOUS | Status: DC
  Filled 2013-04-03: qty 0.5

## 2013-04-03 MED ORDER — DIBUCAINE 1 % RE OINT: 1.0000 "application " | TOPICAL_OINTMENT | RECTAL | Status: DC | PRN

## 2013-04-03 MED ORDER — LACTATED RINGERS IV SOLN: 500.0000 mL | Freq: Once | INTRAVENOUS | Status: DC

## 2013-04-03 MED ORDER — FENTANYL CITRATE 0.05 MG/ML IJ SOLN
100.0000 ug | INTRAMUSCULAR | Status: DC | PRN
  Administered 2013-04-03: 100 ug via INTRAVENOUS
  Filled 2013-04-03: qty 2

## 2013-04-03 MED ORDER — LANOLIN HYDROUS EX OINT: TOPICAL_OINTMENT | CUTANEOUS | Status: DC | PRN

## 2013-04-03 MED ORDER — ACETAMINOPHEN 325 MG PO TABS: 650.0000 mg | ORAL_TABLET | ORAL | Status: DC | PRN

## 2013-04-03 MED ORDER — IBUPROFEN 600 MG PO TABS
600.0000 mg | ORAL_TABLET | Freq: Four times a day (QID) | ORAL | Status: DC
  Administered 2013-04-03 – 2013-04-05 (×9): 600 mg via ORAL
  Filled 2013-04-03 (×9): qty 1

## 2013-04-03 MED ORDER — HYDROXYZINE HCL 50 MG PO TABS
50.0000 mg | ORAL_TABLET | Freq: Four times a day (QID) | ORAL | Status: DC | PRN
  Filled 2013-04-03: qty 1

## 2013-04-03 MED ORDER — LACTATED RINGERS IV SOLN: 500.0000 mL | INTRAVENOUS | Status: DC | PRN

## 2013-04-03 MED ORDER — PHENYLEPHRINE 40 MCG/ML (10ML) SYRINGE FOR IV PUSH (FOR BLOOD PRESSURE SUPPORT)
80.0000 ug | PREFILLED_SYRINGE | INTRAVENOUS | Status: DC | PRN
  Filled 2013-04-03: qty 2

## 2013-04-03 MED ORDER — SENNOSIDES-DOCUSATE SODIUM 8.6-50 MG PO TABS
2.0000 | ORAL_TABLET | Freq: Every day | ORAL | Status: DC
  Administered 2013-04-03 – 2013-04-04 (×2): 2 via ORAL

## 2013-04-03 MED ORDER — ONDANSETRON HCL 4 MG/2ML IJ SOLN: 4.0000 mg | Freq: Four times a day (QID) | INTRAMUSCULAR | Status: DC | PRN

## 2013-04-03 MED ORDER — SODIUM CHLORIDE 0.9 % IV SOLN
2.0000 g | Freq: Four times a day (QID) | INTRAVENOUS | Status: DC
  Administered 2013-04-03: 2 g via INTRAVENOUS
  Filled 2013-04-03 (×2): qty 2000

## 2013-04-03 MED ORDER — IBUPROFEN 600 MG PO TABS: 600.0000 mg | ORAL_TABLET | Freq: Four times a day (QID) | ORAL | Status: DC | PRN

## 2013-04-03 MED ORDER — LIDOCAINE HCL (PF) 1 % IJ SOLN
30.0000 mL | INTRAMUSCULAR | Status: DC | PRN
  Filled 2013-04-03 (×2): qty 30

## 2013-04-03 MED ORDER — FENTANYL 2.5 MCG/ML BUPIVACAINE 1/10 % EPIDURAL INFUSION (WH - ANES)
INTRAMUSCULAR | Status: DC | PRN
  Administered 2013-04-03: 14 mL/h via EPIDURAL

## 2013-04-03 MED ORDER — DIPHENHYDRAMINE HCL 25 MG PO CAPS: 25.0000 mg | ORAL_CAPSULE | Freq: Four times a day (QID) | ORAL | Status: DC | PRN

## 2013-04-03 MED ORDER — OXYCODONE-ACETAMINOPHEN 5-325 MG PO TABS: 1.0000 | ORAL_TABLET | ORAL | Status: DC | PRN

## 2013-04-03 MED ORDER — OXYTOCIN 40 UNITS IN LACTATED RINGERS INFUSION - SIMPLE MED: 62.5000 mL/h | INTRAVENOUS | Status: DC | PRN

## 2013-04-03 MED ORDER — BENZOCAINE-MENTHOL 20-0.5 % EX AERO
1.0000 "application " | INHALATION_SPRAY | CUTANEOUS | Status: DC | PRN
  Filled 2013-04-03: qty 56

## 2013-04-03 MED ORDER — FLEET ENEMA 7-19 GM/118ML RE ENEM: 1.0000 | ENEMA | Freq: Every day | RECTAL | Status: DC | PRN

## 2013-04-03 MED ORDER — PENICILLIN G POTASSIUM 5000000 UNITS IJ SOLR
5.0000 10*6.[IU] | Freq: Once | INTRAVENOUS | Status: DC
  Administered 2013-04-03: 5 10*6.[IU] via INTRAVENOUS
  Filled 2013-04-03: qty 5

## 2013-04-03 MED ORDER — SODIUM CHLORIDE 0.9 % IJ SOLN: 3.0000 mL | INTRAMUSCULAR | Status: DC | PRN

## 2013-04-03 MED ORDER — ZOLPIDEM TARTRATE 5 MG PO TABS: 5.0000 mg | ORAL_TABLET | Freq: Every evening | ORAL | Status: DC | PRN

## 2013-04-03 MED ORDER — ONDANSETRON HCL 4 MG/2ML IJ SOLN: 4.0000 mg | INTRAMUSCULAR | Status: DC | PRN

## 2013-04-03 MED ORDER — OXYTOCIN 40 UNITS IN LACTATED RINGERS INFUSION - SIMPLE MED
62.5000 mL/h | INTRAVENOUS | Status: DC
  Filled 2013-04-03: qty 1000

## 2013-04-03 MED ORDER — FENTANYL 2.5 MCG/ML BUPIVACAINE 1/10 % EPIDURAL INFUSION (WH - ANES)
14.0000 mL/h | INTRAMUSCULAR | Status: DC | PRN
  Filled 2013-04-03: qty 125

## 2013-04-03 MED ORDER — TETANUS-DIPHTH-ACELL PERTUSSIS 5-2.5-18.5 LF-MCG/0.5 IM SUSP
0.5000 mL | Freq: Once | INTRAMUSCULAR | Status: AC
  Administered 2013-04-05: 0.5 mL via INTRAMUSCULAR

## 2013-04-03 MED ORDER — LACTATED RINGERS IV SOLN: INTRAVENOUS | Status: DC

## 2013-04-03 MED ORDER — PENICILLIN G POTASSIUM 5000000 UNITS IJ SOLR
2.5000 10*6.[IU] | INTRAMUSCULAR | Status: DC
  Filled 2013-04-03 (×3): qty 2.5

## 2013-04-03 MED ORDER — WITCH HAZEL-GLYCERIN EX PADS: 1.0000 "application " | MEDICATED_PAD | CUTANEOUS | Status: DC | PRN

## 2013-04-03 MED ORDER — ONDANSETRON HCL 4 MG PO TABS: 4.0000 mg | ORAL_TABLET | ORAL | Status: DC | PRN

## 2013-04-03 MED ORDER — SIMETHICONE 80 MG PO CHEW: 80.0000 mg | CHEWABLE_TABLET | ORAL | Status: DC | PRN

## 2013-04-03 MED ORDER — CITRIC ACID-SODIUM CITRATE 334-500 MG/5ML PO SOLN: 30.0000 mL | ORAL | Status: DC | PRN

## 2013-04-03 MED ORDER — PRENATAL MULTIVITAMIN CH
1.0000 | ORAL_TABLET | Freq: Every day | ORAL | Status: DC
  Administered 2013-04-03 – 2013-04-04 (×2): 1 via ORAL
  Filled 2013-04-03 (×2): qty 1

## 2013-04-03 MED ORDER — PHENYLEPHRINE 40 MCG/ML (10ML) SYRINGE FOR IV PUSH (FOR BLOOD PRESSURE SUPPORT)
80.0000 ug | PREFILLED_SYRINGE | INTRAVENOUS | Status: DC | PRN
  Filled 2013-04-03: qty 5
  Filled 2013-04-03: qty 2

## 2013-04-03 MED ORDER — SODIUM CHLORIDE 0.9 % IJ SOLN: 3.0000 mL | Freq: Two times a day (BID) | INTRAMUSCULAR | Status: DC

## 2013-04-03 MED ORDER — SODIUM CHLORIDE 0.9 % IV SOLN: 250.0000 mL | INTRAVENOUS | Status: DC | PRN

## 2013-04-03 MED ORDER — EPHEDRINE 5 MG/ML INJ
10.0000 mg | INTRAVENOUS | Status: DC | PRN
  Filled 2013-04-03: qty 2

## 2013-04-03 MED ORDER — DIPHENHYDRAMINE HCL 50 MG/ML IJ SOLN
12.5000 mg | INTRAMUSCULAR | Status: DC | PRN
Start: 2013-04-03 — End: 2013-04-03

## 2013-04-03 MED ORDER — FLEET ENEMA 7-19 GM/118ML RE ENEM: 1.0000 | ENEMA | RECTAL | Status: DC | PRN

## 2013-04-03 NOTE — MAU Note (Signed)
Called into the patient room after she heard a pop. Thinks her water has broken. Bloody show on perineum. Slide collected however only RBCs seen on the slide. Joellyn Haff CNM notified.

## 2013-04-03 NOTE — Anesthesia Postprocedure Evaluation (Signed)
  Anesthesia Post-op Note  Patient: Shelia Martin  Procedure(s) Performed: * No procedures listed *  Patient Location: Mother Baby  Anesthesia Type:Epidural  Level of Consciousness: awake  Airway and Oxygen Therapy: Patient Spontanous Breathing  Post-op Pain: mild  Post-op Assessment: Patient's Cardiovascular Status Stable, Respiratory Function Stable, No signs of Nausea or vomiting, Pain level controlled, No headache, No residual numbness and No residual motor weakness  Post-op Vital Signs: stable  Complications: No apparent anesthesia complications
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
## 2013-04-03 NOTE — Anesthesia Procedure Notes (Signed)
Epidural Patient location during procedure: OB  Staffing Anesthesiologist: Phillips Grout Performed by: anesthesiologist   Preanesthetic Checklist Completed: patient identified, site marked, surgical consent, pre-op evaluation, timeout performed, IV checked, risks and benefits discussed and monitors and equipment checked  Epidural Patient position: sitting Prep: ChloraPrep Patient monitoring: heart rate, continuous pulse ox and blood pressure Approach: right paramedian Injection technique: LOR saline  Needle:  Needle type: Tuohy  Needle gauge: 17 G Needle length: 9 cm and 9 Needle insertion depth: 5 cm Catheter type: closed end flexible Catheter size: 20 Guage Catheter at skin depth: 9 cm Test dose: negative  Assessment Events: blood not aspirated, injection not painful, no injection resistance, negative IV test and no paresthesia  Additional Notes   Patient tolerated the insertion well without complications.

## 2013-04-03 NOTE — Lactation Note (Signed)
This note was copied from the chart of Shelia Jacora Novell. Lactation Consultation Note: Initial visit with mom. She is very sleepy but states baby nursed well after delivery and again once she got to [facility]. No questions at present. BF brochure left with mom.   Patient Name: Shelia Martin Date: 04/03/2013 Reason for consult: Initial assessment   Maternal Data Formula Feeding for Exclusion: No Infant to breast within first hour of birth: Yes Does the patient have breastfeeding experience prior to this delivery?: No  Feeding Feeding Type: Breast Milk Length of feed: 15 min  LATCH Score/Interventions                      Lactation Tools Discussed/Used     Consult Status Consult Status: Follow-up Date: 04/04/13 Follow-up type: In-patient    Pamelia Hoit 04/03/2013, 11:37 AM

## 2013-04-03 NOTE — Anesthesia Preprocedure Evaluation (Signed)

## 2013-04-03 NOTE — MAU Note (Signed)
Patient complains of additional leaking. New slide collected. Joellyn Haff CNM to assess for ferning.

## 2013-04-04 ENCOUNTER — Encounter: Payer: Self-pay | Admitting: Obstetrics & Gynecology

## 2013-04-04 ENCOUNTER — Encounter (HOSPITAL_COMMUNITY): Payer: Self-pay | Admitting: *Deleted

## 2013-04-04 MED ORDER — IBUPROFEN 600 MG PO TABS: 600.0000 mg | ORAL_TABLET | Freq: Four times a day (QID) | ORAL | Status: DC

## 2013-04-04 NOTE — Discharge Summary (Signed)
Obstetric Discharge Summary Reason for Admission: onset of labor Prenatal Procedures: NST Intrapartum Procedures: spontaneous vaginal delivery Postpartum Procedures: none Complications-Operative and Postpartum: none Hemoglobin  Date Value Range Status  04/03/2013 12.3  12.0 - 15.0 g/dL Final     HCT  Date Value Range Status  04/03/2013 35.8* 36.0 - 46.0 % Final    Physical Exam:  General: alert, cooperative and appears stated age Lochia: appropriate Uterine Fundus: firm Incision: N/A DVT Evaluation: No evidence of DVT seen on physical exam. Negative Homan's sign. No cords or calf tenderness.  Discharge Diagnoses: Term Pregnancy-delivered  Discharge Information: Date: 04/04/2013 Activity: pelvic rest Diet: routine Medications: Ibuprofen Condition: stable Instructions: refer to practice specific booklet Discharge to: home   Newborn Data: Live born female  Birth Weight: 5 lb 6.4 oz (2449 g) APGAR: 9, 9  Home with mother.  Patient's choice of birth control is still not clear.  IUDs, birth control pills, and Nexplanon were discussed. She has used birth control pills in the past and will likely use them again.    CLARK, MICHAEL L 04/04/2013, 7:38 AM  I have seen and examined this patient and agree the above assessment. CRESENZO-DISHMAN,Kalaysia Demonbreun 04/04/2013 7:53 AM

## 2013-04-04 NOTE — Discharge Summary (Signed)
Attestation of Attending Supervision of Advanced Practitioner (CNM/NP): Evaluation and management procedures were performed by the Advanced Practitioner under my supervision and collaboration. I have reviewed the Advanced Practitioner's note and chart, and I agree with the management and plan.  Sugey Trevathan H. 8:41 AM   

## 2013-04-05 ENCOUNTER — Encounter: Payer: BC Managed Care – PPO | Admitting: Family Medicine

## 2013-04-05 NOTE — Discharge Summary (Signed)
Obstetric Discharge Summary Reason for Admission: onset of labor Prenatal Procedures: NST Intrapartum Procedures: spontaneous vaginal delivery and GBS prophylaxis Postpartum Procedures: none Complications-Operative and Postpartum: none Hemoglobin  Date Value Range Status  04/03/2013 12.3  12.0 - 15.0 g/dL Final     HCT  Date Value Range Status  04/03/2013 35.8* 36.0 - 46.0 % Final    Physical Exam:  General: alert, cooperative, appears stated age and no distress Lochia: appropriate Uterine Fundus: firm Incision: dehiscence present: N/A DVT Evaluation: No evidence of DVT seen on physical exam. Negative Homan's sign. No cords or calf tenderness. No significant calf/ankle edema.  Discharge Diagnoses: Term Pregnancy-delivered  Discharge Information: Date: 04/05/2013 Activity: pelvic rest Diet: routine Medications: PNV and Ibuprofen Condition: stable Instructions: refer to practice specific booklet Discharge to: home Follow-up Information   Follow up with [facility]. Schedule an appointment as soon as possible for a visit in 5 weeks.   Specialty:  Obstetrics and Gynecology   Contact information:   [address] [place] [address] [phone]      Newborn Data: Live born female  Birth Weight: 5 lb 6.4 oz (2449 g) APGAR: 9, 9  Home with FOB or her father.  Jacquelin Hawking, MD 04/05/2013, 7:51 AM Evaluation and management procedures were performed by Resident physician under my supervision/collaboration. Chart reviewed, patient examined by me and I agree with management and plan Danae Orleans, CNM 04/05/2013 9:53 AM  .

## 2013-04-06 NOTE — Discharge Summary (Signed)
Attestation of Attending Supervision of Advanced Practitioner (CNM/NP): Evaluation and management procedures were performed by the Advanced Practitioner under my supervision and collaboration. I have reviewed the Advanced Practitioner's note and chart, and I agree with the management and plan.  Danna Casella H. 7:56 AM   

## 2013-05-04 ENCOUNTER — Encounter: Payer: Self-pay | Admitting: Obstetrics & Gynecology

## 2013-05-04 ENCOUNTER — Ambulatory Visit (INDEPENDENT_AMBULATORY_CARE_PROVIDER_SITE_OTHER): Payer: BC Managed Care – PPO | Admitting: Obstetrics & Gynecology

## 2013-05-04 DIAGNOSIS — Z3009 Encounter for other general counseling and advice on contraception: Secondary | ICD-10-CM | POA: Insufficient documentation

## 2013-05-04 DIAGNOSIS — Z309 Encounter for contraceptive management, unspecified: Secondary | ICD-10-CM

## 2013-05-04 MED ORDER — NORETHINDRONE 0.35 MG PO TABS: 1.0000 | ORAL_TABLET | Freq: Every day | ORAL | Status: DC

## 2013-05-04 NOTE — Patient Instructions (Addendum)
Thank you for coming in, today! Everything looks well, today. You have been prescribed a progestin-only oral contraceptive pill. This pill should not interfere with your breast milk production. You may have some irregular/unpredictable bleeding with it, or you may simply have no periods. Make an appointment to come back in about 6 months. At that visit, you can get a full yearly check-up, pap smear, and you may be able to change over to a "regular" birth control pill (combination estrogen and progestin).  Postpartum Care After Vaginal Delivery After you deliver your newborn (postpartum period), the usual stay in the hospital is 24 72 hours. If there were problems with your labor or delivery, or if you have other medical problems, you might be in the hospital longer.  While you are in the hospital, you will receive help and instructions on how to care for yourself and your newborn during the postpartum period.  While you are in the hospital:  Be sure to tell your nurses if you have pain or discomfort, as well as where you feel the pain and what makes the pain worse.  If you had an incision made near your vagina (episiotomy) or if you had some tearing during delivery, the nurses may put ice packs on your episiotomy or tear. The ice packs may help to reduce the pain and swelling.  If you are breastfeeding, you may feel uncomfortable contractions of your uterus for a couple of weeks. This is normal. The contractions help your uterus get back to normal size.  It is normal to have some bleeding after delivery.  For the first 1 3 days after delivery, the flow is red and the amount may be similar to a period.  It is common for the flow to start and stop.  In the first few days, you may pass some small clots. Let your nurses know if you begin to pass large clots or your flow increases.  Do not  flush blood clots down the toilet before having the nurse look at them.  During the next 3 10 days  after delivery, your flow should become more watery and pink or brown-tinged in color.  Ten to fourteen days after delivery, your flow should be a small amount of yellowish-white discharge.  The amount of your flow will decrease over the first few weeks after delivery. Your flow may stop in 6 8 weeks. Most women have had their flow stop by 12 weeks after delivery.  You should change your sanitary pads frequently.  Wash your hands thoroughly with soap and water for at least 20 seconds after changing pads, using the toilet, or before holding or feeding your newborn.  You should feel like you need to empty your bladder within the first 6 8 hours after delivery.  In case you become weak, lightheaded, or faint, call your nurse before you get out of bed for the first time and before you take a shower for the first time.  Within the first few days after delivery, your breasts may begin to feel tender and full. This is called engorgement. Breast tenderness usually goes away within 48 72 hours after engorgement occurs. You may also notice milk leaking from your breasts. If you are not breastfeeding, do not stimulate your breasts. Breast stimulation can make your breasts produce more milk.  Spending as much time as possible with your newborn is very important. During this time, you and your newborn can feel close and get to know each other. Having  your newborn stay in your room (rooming in) will help to strengthen the bond with your newborn. It will give you time to get to know your newborn and become comfortable caring for your newborn.  Your hormones change after delivery. Sometimes the hormone changes can temporarily cause you to feel sad or tearful. These feelings should not last more than a few days. If these feelings last longer than that, you should talk to your caregiver.  If desired, talk to your caregiver about methods of family planning or contraception.  Talk to your caregiver about  immunizations. Your caregiver may want you to have the following immunizations before leaving the hospital:  Tetanus, diphtheria, and pertussis (Tdap) or tetanus and diphtheria (Td) immunization. It is very important that you and your family (including grandparents) or others caring for your newborn are up-to-date with the Tdap or Td immunizations. The Tdap or Td immunization can help protect your newborn from getting ill.  Rubella immunization.  Varicella (chickenpox) immunization.  Influenza immunization. You should receive this annual immunization if you did not receive the immunization during your pregnancy. Document Released: 06/01/2007 Document Revised: 04/28/2012 Document Reviewed: 03/31/2012 [facility] Patient Information 2014 [place], [place].

## 2013-05-04 NOTE — Progress Notes (Signed)
Patient ID: Shelia Martin, female   DOB: 07-08-1991, 22 y.o.   MRN: [id number] Subjective:     Shelia Martin is a 22 y.o. female who presents for a postpartum visit. She is 1 month postpartum following a spontaneous vaginal delivery. I have fully reviewed the prenatal and intrapartum course. The delivery was at 39 gestational weeks. Outcome: spontaneous vaginal delivery. Anesthesia: epidural. Postpartum course has been uncomplicated. Baby's course has been uncomplicated. Baby is feeding by breast. Bleeding no bleeding. Bowel function is normal. Bladder function is normal. Patient is not sexually active. Contraception method is abstinence since delivery. She is interested in restarting oral contraceptive pills. She is unsure what pills she used in the past. Postpartum depression screening: negative.  The following portions of the patient's history were reviewed and updated as appropriate: allergies, current medications, past family history, past medical history, past social history, past surgical history and problem list.  Review of Systems Pertinent items are noted in HPI.   Objective:    BP 110/68  Pulse 69  Ht 5' 3.5" (1.613 m)  Wt 136 lb 14.4 oz (62.097 kg)  BMI 23.87 kg/m2  Breastfeeding? Yes  General:  alert, cooperative, appears stated age and no distress   Breasts:  negative  Lungs: clear to auscultation bilaterally  Heart:  regular rate and rhythm, S1, S2 normal, no murmur, click, rub or gallop  Abdomen: soft, non-tender; bowel sounds normal; no masses,  no organomegaly   Vulva:  not evaluated  Vagina: not evaluated  Cervix:  not evaluated  Corpus: not examined  Adnexa:  not evaluated  Rectal Exam: Not performed.        Assessment:     Normal postpartum exam. Pap smear not done at today's visit--pt states she had a pap smear when she was 19 but did not have one earlier in this pregnancy.   Plan:    1. Contraception: oral progesterone-only contraceptive 2. Return to  clinic in 6 months for full yearly visit, GYN exam, pap smear. 3. Follow up in: 6 months or as needed.   Pt seen with Dr. Maryjean Ka, PGY-2 [facility], attending Dr. Debroah Loop. Agree with note and plan.  Adam Phenix, MD 05/04/2013

## 2013-06-03 ENCOUNTER — Ambulatory Visit (INDEPENDENT_AMBULATORY_CARE_PROVIDER_SITE_OTHER): Payer: BC Managed Care – PPO | Admitting: Family Medicine

## 2013-06-03 VITALS — BP 100/80 | HR 62 | Temp 97.0°F | Resp 18

## 2013-06-03 DIAGNOSIS — Z23 Encounter for immunization: Secondary | ICD-10-CM

## 2013-06-03 DIAGNOSIS — L309 Dermatitis, unspecified: Secondary | ICD-10-CM

## 2013-06-03 DIAGNOSIS — L659 Nonscarring hair loss, unspecified: Secondary | ICD-10-CM

## 2013-06-03 DIAGNOSIS — L259 Unspecified contact dermatitis, unspecified cause: Secondary | ICD-10-CM

## 2013-06-03 MED ORDER — TRIAMCINOLONE ACETONIDE 0.1 % EX CREA: TOPICAL_CREAM | Freq: Two times a day (BID) | CUTANEOUS | Status: DC

## 2013-06-03 NOTE — Progress Notes (Signed)
  Subjective:    Patient ID: Shelia Martin, female    DOB: 27-Jan-1991, 22 y.o.   MRN: [id number]  HPI  Pt with concern for eczema, thinning hair and dry skin. She noticed worsened eczema on her arms and face. Very dry spots beside her nose and around her eyes. Has worsened since delivery, skin did well during pregnancy. Using Caress soap on face and skin.  She is breastfeeding 8 weeks post-partum Doing well. No post partum depression symptoms Taking her MVI Ran out of previous cream for eczema Needs flu shot  Review of Systems  GEN- denies fatigue, fever, weight loss,weakness, recent illness HEENT- denies eye drainage, change in vision, nasal discharge, CVS- denies chest pain, palpitations RESP- denies SOB, cough, wheeze ABD- denies N/V, change in stools, abd pain GU- denies dysuria, hematuria, dribbling, incontinence MSK- denies joint pain, muscle aches, injury Neuro- denies headache, dizziness, syncope, seizure activity      Objective:   Physical Exam GEN- NAD, alert and oriented x3 HEENT- PERRL, EOMI, non injected sclera, pink conjunctiva, MMM, oropharynx clear Neck- Supple,  CVS- RRR, no murmur RESP-CTAB EXT- No edema Pulses- Radial 2+ Skin- eczematous rash on hands, elbows, fine rash around nasolabial creases, dryness noted around eyes and neck Hair- thinning around temple areas, no alopecia spots        Assessment & Plan:

## 2013-06-03 NOTE — Patient Instructions (Signed)
Continue prenatal vitamin  Take biotin capsule once a day  Use the cream twice a day Flu shot given Cetaphil- Mild cleanser and moisturizer  F/U as needed

## 2013-06-05 ENCOUNTER — Encounter: Payer: Self-pay | Admitting: Family Medicine

## 2013-06-05 DIAGNOSIS — L309 Dermatitis, unspecified: Secondary | ICD-10-CM | POA: Insufficient documentation

## 2013-06-05 DIAGNOSIS — L659 Nonscarring hair loss, unspecified: Secondary | ICD-10-CM | POA: Insufficient documentation

## 2013-06-05 NOTE — Assessment & Plan Note (Signed)
Discussed topical steroids use, moisturizing cetaphil or mild moisturizer and cleanser

## 2013-06-05 NOTE — Assessment & Plan Note (Signed)
Very common with hormone fluctations after pregnancy, continue water, prenatal vitamin can add biotin vitamin

## 2013-06-21 ENCOUNTER — Encounter: Payer: Self-pay | Admitting: Family Medicine

## 2013-06-21 ENCOUNTER — Other Ambulatory Visit: Payer: Self-pay | Admitting: Family Medicine

## 2013-06-21 ENCOUNTER — Ambulatory Visit (INDEPENDENT_AMBULATORY_CARE_PROVIDER_SITE_OTHER): Payer: BC Managed Care – PPO | Admitting: Family Medicine

## 2013-06-21 VITALS — BP 104/70 | HR 60 | Temp 97.5°F | Resp 18 | Wt 129.0 lb

## 2013-06-21 DIAGNOSIS — Z202 Contact with and (suspected) exposure to infections with a predominantly sexual mode of transmission: Secondary | ICD-10-CM

## 2013-06-21 DIAGNOSIS — N898 Other specified noninflammatory disorders of vagina: Secondary | ICD-10-CM

## 2013-06-21 DIAGNOSIS — Z20828 Contact with and (suspected) exposure to other viral communicable diseases: Secondary | ICD-10-CM

## 2013-06-21 DIAGNOSIS — Z9189 Other specified personal risk factors, not elsewhere classified: Secondary | ICD-10-CM

## 2013-06-21 NOTE — Assessment & Plan Note (Signed)
Wet Prep, GC probe

## 2013-06-21 NOTE — Assessment & Plan Note (Signed)
Culture per above HIV/RPR to be done

## 2013-06-21 NOTE — Patient Instructions (Signed)
Call if referral is needed for the Huntington's Disease We will call with results F/U as needed

## 2013-06-21 NOTE — Progress Notes (Signed)
  Subjective:    Patient ID: Shelia Martin, female    DOB: 02/20/1991, 22 y.o.   MRN: [id number]  HPI  Pt here for STD check. Currently 8 weeks post partum. Had sexual intercourse with her child's father but concerned about him being monogomous. Request STD check. Has has some vaginal discharge past few days. No abd pain. LMP 2 weeks ago   Review of Systems  GEN- denies fatigue, fever, weight loss,weakness, recent illness ABD- denies N/V, change in stools, abd pain GU- denies dysuria, hematuria, dribbling, incontinence, +vaginal discharge       Objective:   Physical Exam GEN- NAD, alert and oriented x3 ABD-NABS,soft,NT,ND GU- normal external genitalia, vaginal mucosa pink and moist, cervix visualized no growth, no blood form os, + discharge, no CMT, no ovarian masses, uterus normal size        Assessment & Plan:    Note I did discuss Huntington's disease genetic work up with pt Her maternal grandmother had Huntingtons and 3 of her 7 siblings, Her mother has not shown any signs of disease or any aunts or uncles on maternal side. Given information for testing.

## 2013-06-22 LAB — GC/CHLAMYDIA PROBE AMP: GC Probe RNA: NEGATIVE

## 2013-06-22 LAB — HIV ANTIBODY (ROUTINE TESTING W REFLEX): HIV: NONREACTIVE

## 2013-07-27 ENCOUNTER — Telehealth: Payer: Self-pay | Admitting: *Deleted

## 2013-07-27 MED ORDER — MEDROXYPROGESTERONE ACETATE 150 MG/ML IM SUSP: 150.0000 mg | INTRAMUSCULAR | Status: DC

## 2013-07-27 NOTE — Telephone Encounter (Signed)
Yes, she can do Depo Shot Send in Script to pharmacy and she can bring in for nurse visit

## 2013-07-27 NOTE — Telephone Encounter (Signed)
Is it ok to change her to the shot

## 2013-07-27 NOTE — Telephone Encounter (Signed)
Patient left message asking if she can switch from birth control pills to the depo shot.

## 2013-07-28 NOTE — Telephone Encounter (Signed)
Tried calling pt back number not accepting calls at this time

## 2013-07-28 NOTE — Telephone Encounter (Signed)
Called pt and was unable to leave message due to phone not accepting calls at this time.

## 2013-07-28 NOTE — Telephone Encounter (Signed)
Called patient and message stated this person is not receiving calls at this time, unable to leave message

## 2013-08-05 NOTE — Telephone Encounter (Signed)
Pt has appt with GYN in Jan to get follow up on depo shot

## 2013-08-28 IMAGING — US US OB DETAIL+14 WK
1 series · 12 of 28 positions shown · non-contrast
Comparison: none

[Series 1: us ob detail +14 wk · 12 of 70 slices shown]
[im 3/70]
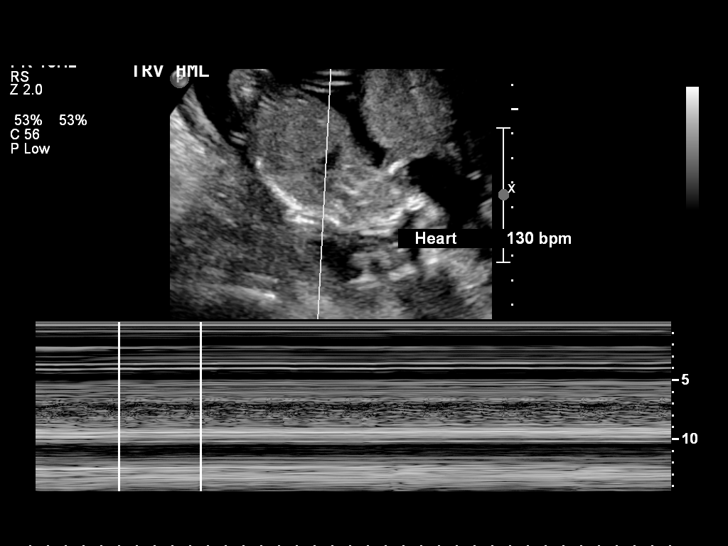
[im 8/70]
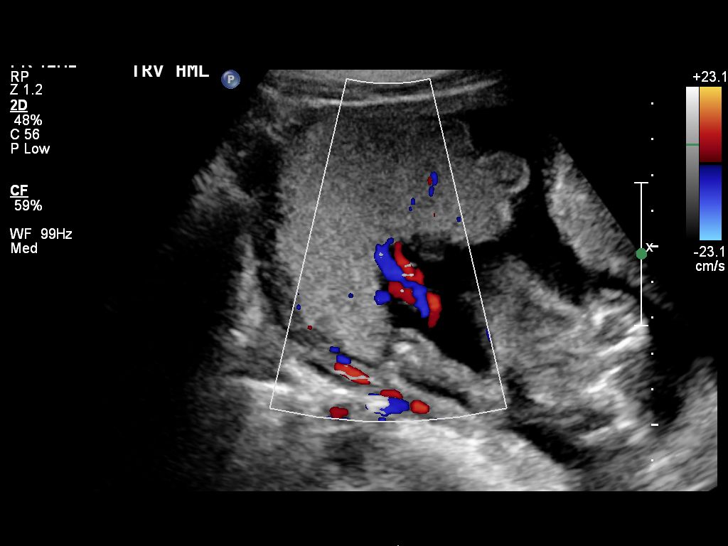
[im 13/70]
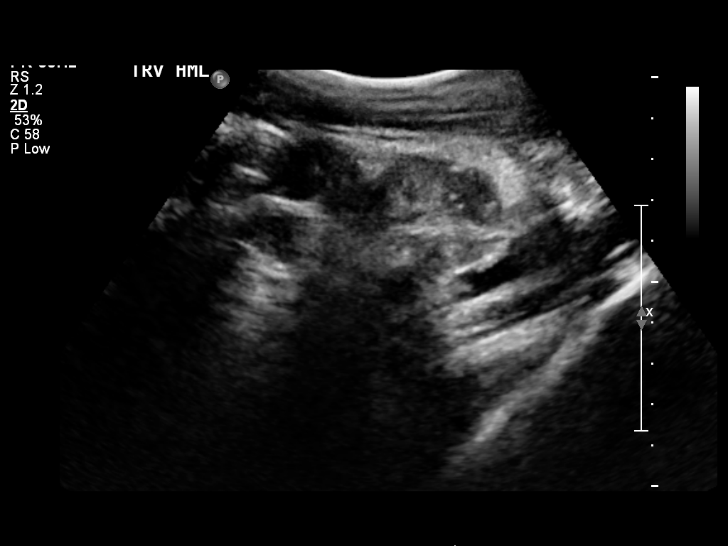
[im 21/70]
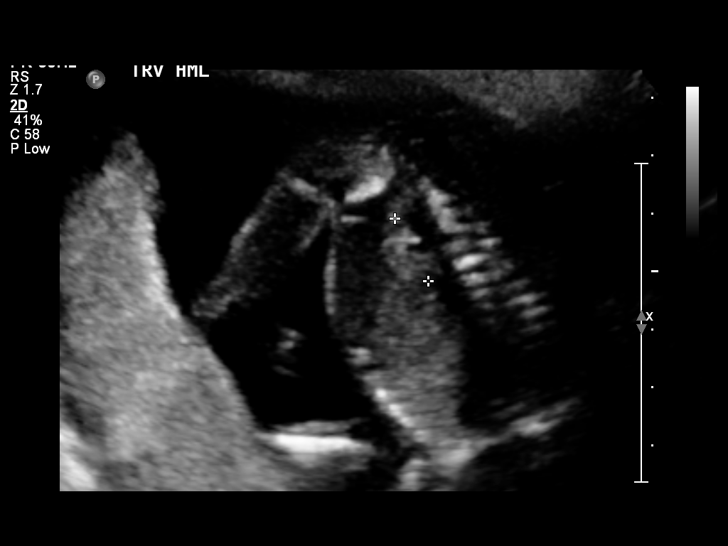
[im 26/70]
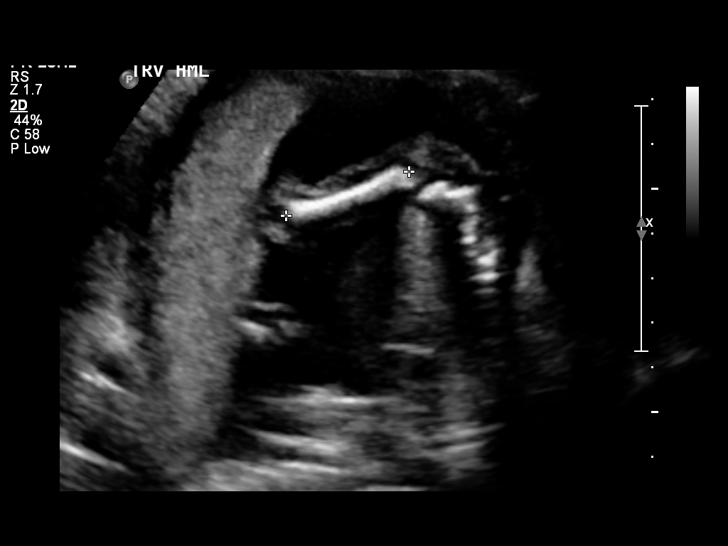
[im 31/70]
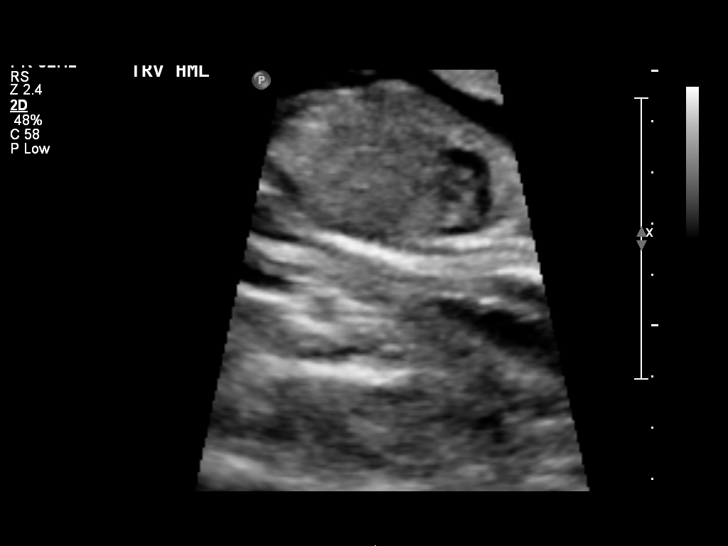
[im 39/70]
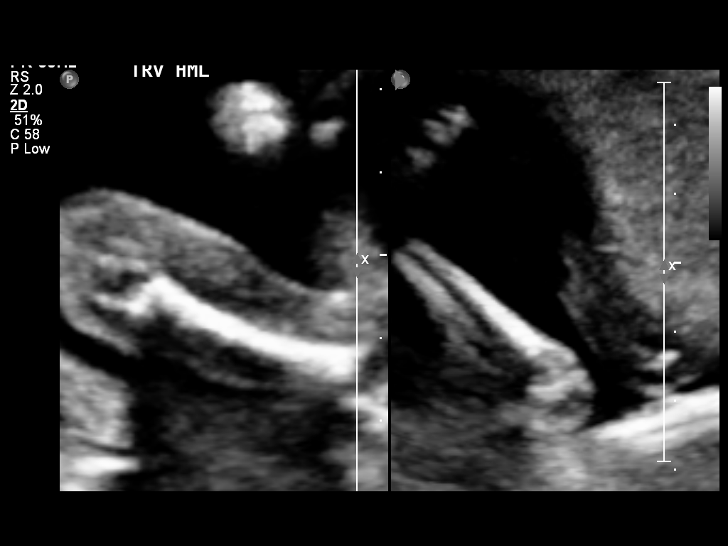
[im 44/70]
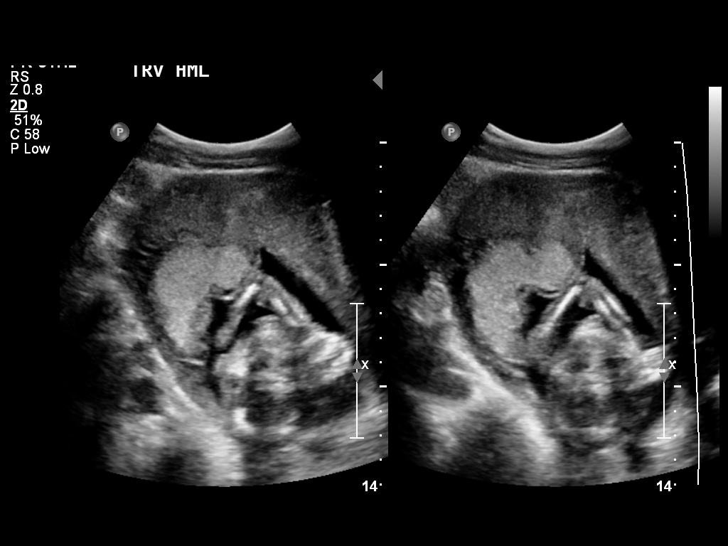
[im 49/70]
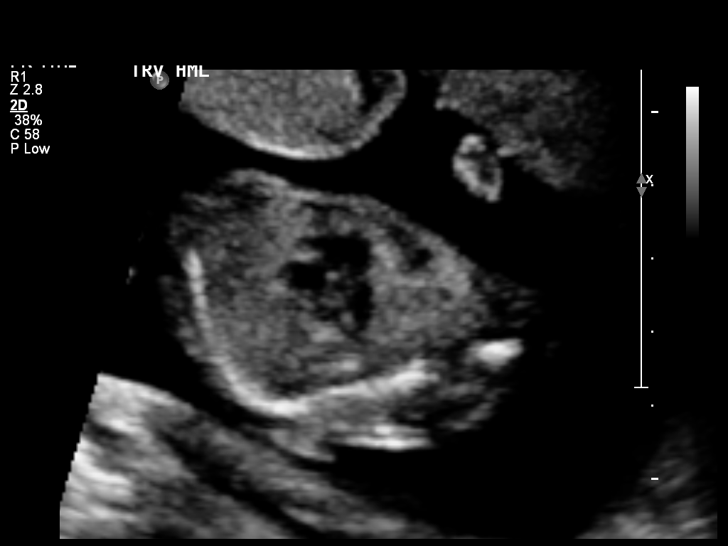
[im 57/70]
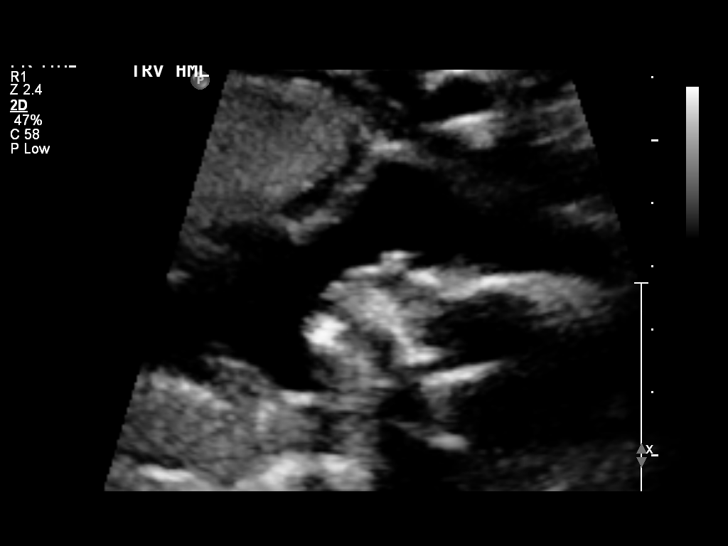
[im 62/70]
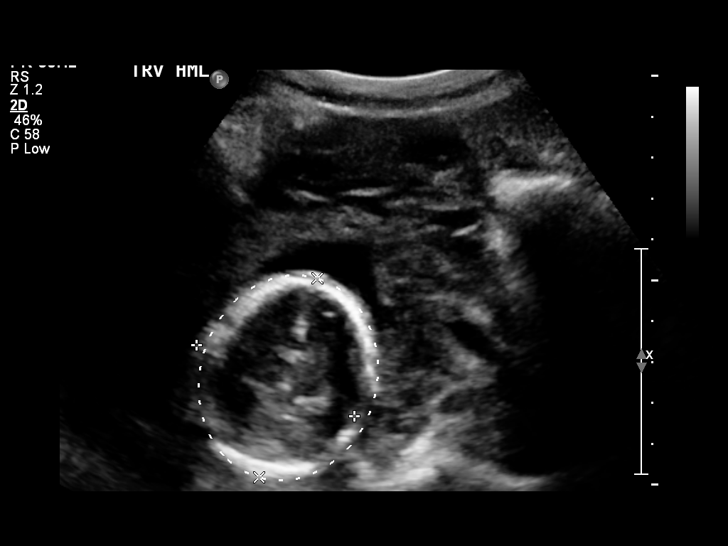
[im 67/70]
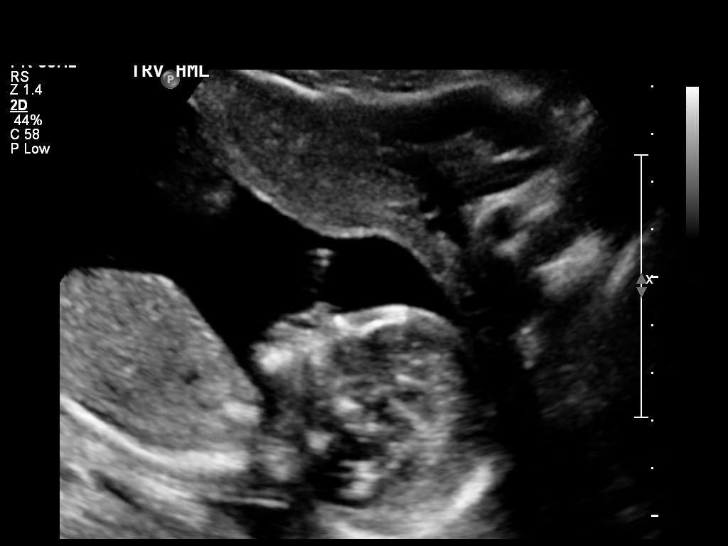

[12 of 28 positions shown; findings below may reference images not displayed]

OBSTETRICS REPORT
                      (Signed Final 11/01/2012 [DATE])

Service(s) Provided

 US OB DETAIL + 14 WK                                  76811.0
Indications

 Detailed fetal anatomic survey
Fetal Evaluation

 Num Of Fetuses:    1
 Fetal Heart Rate:  130                         bpm
 Cardiac Activity:  Observed
 Presentation:      Variable
 Placenta:          Fundal, above cervical os
 P. Cord            Visualized, central
 Insertion:

 Amniotic Fluid
 AFI FV:      Subjectively within normal limits
                                             Larg Pckt:     4.6  cm
Biometry

 BPD:     40.3  mm    G. Age:   18w 1d                CI:         75.3   70 - 86
                                                      FL/HC:      19.5   15.8 -
                                                                         18
 HC:     147.3  mm    G. Age:   17w 6d       43  %    HC/AC:      1.21   1.07 -

 AC:     121.4  mm    G. Age:   17w 6d       46  %    FL/BPD:
 FL:      28.7  mm    G. Age:   18w 6d       78  %    FL/AC:      23.6   20 - 24
 HUM:     27.8  mm    G. Age:   18w 6d       85  %

 Est. FW:     231  gm      0 lb 8 oz     56  %
Gestational Age

 LMP:           17w 6d       Date:   06/29/12                 EDD:   04/05/13
 U/S Today:     18w 1d                                        EDD:   04/03/13
 Best:          17w 6d    Det. By:   U/S C R L (09/22/12)     EDD:   04/05/13
2nd Trimester Genetic Sonogram - Trisomy 21 Screening

 Age:                                             20          Risk=1:   885

 Structural anomalies (inc. cardiac):             No
 Echogenic bowel:                                 No
 Hypoplastic / absent midphalanx 5th Digit:       No
 Wide space 2st-9nd toes:                         N/A
 Pyelectasis:                                     No
 2-vessel umbilical cord:                         No
 Echogenic cardiac foci:                          No
Anatomy

 Cranium:          Appears normal         Aortic Arch:      Appears normal
 Fetal Cavum:      Not well visualized    Ductal Arch:      Appears normal
 Ventricles:       Not well visualized    Diaphragm:        Appears normal
 Choroid Plexus:   Not well visualized    Stomach:          Appears normal, left
                                                            sided
 Cerebellum:       Not well visualized    Abdomen:          Appears normal
 Posterior Fossa:  Not well visualized    Abdominal Wall:   Appears nml (cord
                                                            insert, abd wall)
 Nuchal Fold:      Not well visualized    Cord Vessels:     Appears normal (3
                                                            vessel cord)
 Face:             Appears normal         Kidneys:          Appear normal
                   (orbits and profile)
 Lips:             Not well visualized    Bladder:          Appears normal
 Heart:            Appears normal         Spine:            Not well visualized
                   (4CH, axis, and
                   situs)
 RVOT:             Appears normal         Lower             Appears normal
                                          Extremities:
 LVOT:             Appears normal         Upper             Appears normal
                                          Extremities:

 Other:  Fetus appears to be a female. Nasal bone visualized. Heels
         visualized. 5th digit visualized. Technically difficult due to fetal
         position.
Cervix Uterus Adnexa

 Cervical Length:   3.01      cm

 Cervix:       Normal appearance by transabdominal scan.

 Adnexa:     No abnormality visualized.
Impression

 Single living IUP with assigned GA of 17w 6d. EGA by
 ultrasound is concordant with dating by LMP.
 Technically difficult exam due to fetal position. The fetal
 cranial structures, lips, and spine were not well visualized.
 The visualized fetal anatomy appears normal.
 Normal amniotic fluid volume and cervical length.
Recommendations

 Ultrasound follow-up in 2 to 3 weeks is suggested for hopeful
 improved visualization of the fetal cavum, ventricles, chorioid
 plexus, cerebellum, posterior fossa, nuchal fold, lips, and
 spine.

## 2013-08-30 ENCOUNTER — Telehealth: Payer: Self-pay | Admitting: Family Medicine

## 2013-08-30 MED ORDER — LORATADINE 10 MG PO TABS
10.0000 mg | ORAL_TABLET | Freq: Every day | ORAL | Status: DC
Start: 2013-08-30 — End: 2014-03-10

## 2013-08-30 MED ORDER — FLUTICASONE PROPIONATE 50 MCG/ACT NA SUSP: 2.0000 | Freq: Every day | NASAL | Status: DC

## 2013-08-30 NOTE — Telephone Encounter (Signed)
Pt here with daughter with allergy symptoms, sneezing, runny nose, watery eyes Was on allergy meds before She does BF some Will send claritin and flonase

## 2013-09-09 ENCOUNTER — Ambulatory Visit: Payer: BC Managed Care – PPO | Admitting: Medical

## 2013-10-06 ENCOUNTER — Telehealth: Payer: Self-pay | Admitting: Family Medicine

## 2013-10-06 MED ORDER — TRIAMCINOLONE ACETONIDE 0.1 % EX CREA: TOPICAL_CREAM | Freq: Two times a day (BID) | CUTANEOUS | Status: DC

## 2013-10-06 NOTE — Telephone Encounter (Signed)
Call back number is [phone] [facility] [address]  Pt is needing a refill on triamcinolone cream (KENALOG) 0.1 %

## 2013-10-06 NOTE — Telephone Encounter (Signed)
Meds refilled.

## 2013-10-19 ENCOUNTER — Other Ambulatory Visit: Payer: Self-pay | Admitting: Family Medicine

## 2013-10-19 ENCOUNTER — Encounter: Payer: Self-pay | Admitting: Family Medicine

## 2013-10-19 ENCOUNTER — Ambulatory Visit (INDEPENDENT_AMBULATORY_CARE_PROVIDER_SITE_OTHER): Payer: BC Managed Care – PPO | Admitting: Family Medicine

## 2013-10-19 VITALS — BP 118/76 | HR 70 | Temp 98.3°F | Resp 14 | Ht 66.0 in | Wt 127.0 lb

## 2013-10-19 DIAGNOSIS — J069 Acute upper respiratory infection, unspecified: Secondary | ICD-10-CM

## 2013-10-19 DIAGNOSIS — Z202 Contact with and (suspected) exposure to infections with a predominantly sexual mode of transmission: Secondary | ICD-10-CM

## 2013-10-19 LAB — HIV ANTIBODY (ROUTINE TESTING W REFLEX): HIV: NONREACTIVE

## 2013-10-19 LAB — WET PREP FOR TRICH, YEAST, CLUE
Clue Cells Wet Prep HPF POC: NONE SEEN
TRICH WET PREP: NONE SEEN
Yeast Wet Prep HPF POC: NONE SEEN

## 2013-10-19 LAB — RPR

## 2013-10-19 MED ORDER — GUAIFENESIN-CODEINE 100-10 MG/5ML PO SOLN: 10.0000 mL | Freq: Three times a day (TID) | ORAL | Status: DC | PRN

## 2013-10-19 MED ORDER — FLUTICASONE PROPIONATE 50 MCG/ACT NA SUSP: 2.0000 | Freq: Every day | NASAL | Status: DC

## 2013-10-19 NOTE — Patient Instructions (Signed)
We will call with lab results Upper respiratory infection Use flonase and cough medications F/u as needed -- Return with your Depo shot needed this week

## 2013-10-19 NOTE — Progress Notes (Signed)
Patient ID: Shelia Martin, female   DOB: 06/02/1991, 23 y.o.   MRN: [id number]   Subjective:    Patient ID: Shelia CowerAerica K Woodrick, female    DOB: 06/02/1991, [age] y.o.   MRN: [id number]  Patient presents for Cough and Exposure to STD  Patient here with cough and chest tightness for the past few days. She states that she feels she can't catch her breath, she also feels like she cannot breathe out of her nose and she's very clogged up. She has been using vapor rub and some over-the-counter medication. She's also noticed some sneezing and they wondered if it was due to her allergies.  She requested an STD screening she did have a new partner but the relationship is now over. She continues to use Depo-Medrol for birth control and is due for repeat shot this week. She's not had any vaginitis symptoms   Review Of Systems:  GEN- denies fatigue, fever, weight loss,weakness, recent illness HEENT- denies eye drainage, change in vision, +nasal discharge, CVS- denies chest pain, palpitations RESP- denies SOB, +cough, wheeze ABD- denies N/V, change in stools, abd pain GU- denies dysuria, hematuria, dribbling, incontinence MSK- denies joint pain, muscle aches, injury Neuro- denies headache, dizziness, syncope, seizure activity       Objective:    BP 118/76  Pulse 70  Temp(Src) 98.3 F (36.8 C)  Resp 14  Ht 5\' 6"  (1.676 m)  Wt 127 lb (57.607 kg)  BMI 20.51 kg/m2  Breastfeeding? Yes GEN- NAD, alert and oriented x3 HEENT- PERRL, EOMI, non injected sclera, pink conjunctiva, MMM, oropharynx clear, TM clear bilat no effusion,  No maxillary sinus tenderness, inflammed turbinates,  Nasal drainage  Neck- Supple, no LAD CVS- RRR, soft systolic murmur RSB RESP-CTAB GU- normal external genitalia, vaginal mucosa pink and moist, cervix visualized no growth, no blood form os, minimal thin clear discharge, no CMT, no ovarian masses, uterus normal size           Assessment & Plan:      Problem  List Items Addressed This Visit   Potential exposure to STD     STD screening done     Relevant Orders      GC/chlamydia probe amp, genital      WET PREP FOR TRICH, YEAST, CLUE (Completed)      HIV antibody (Completed)      RPR (Completed)    Other Visit Diagnoses   Acute upper respiratory infections of unspecified site    -  Primary    Flonase for nasal symptoms, given Noral AD samples from office, cough medication       Note: This dictation was prepared with Dragon dictation along with smaller phrase technology. Any transcriptional errors that result from this process are unintentional.

## 2013-10-19 NOTE — Assessment & Plan Note (Signed)
STD screening done

## 2013-10-20 LAB — GC/CHLAMYDIA PROBE AMP
CT PROBE, AMP APTIMA: NEGATIVE
GC Probe RNA: NEGATIVE

## 2013-11-22 ENCOUNTER — Telehealth: Payer: Self-pay | Admitting: *Deleted

## 2013-11-22 MED ORDER — TRIAMCINOLONE ACETONIDE 0.1 % EX CREA: TOPICAL_CREAM | Freq: Two times a day (BID) | CUTANEOUS | Status: DC

## 2013-11-22 NOTE — Telephone Encounter (Signed)
Prescription sent to pharmacy. .   Call placed to patient and patient made aware.  

## 2013-11-22 NOTE — Telephone Encounter (Signed)
Message copied by Phillips OdorSIX, CHRISTINA H on Tue Nov 22, 2013  4:15 PM ------      Message from: Malvin JohnsBULLINS, SUSAN S      Created: Tue Nov 22, 2013  3:37 PM       Patient would like to know if we can call in eczema cream for her [phone] [facility] [place]    ------

## 2014-01-20 ENCOUNTER — Other Ambulatory Visit: Payer: Self-pay | Admitting: Family Medicine

## 2014-01-20 DIAGNOSIS — L309 Dermatitis, unspecified: Secondary | ICD-10-CM

## 2014-01-20 NOTE — Telephone Encounter (Signed)
Medication refilled per protocol. 

## 2014-02-24 ENCOUNTER — Other Ambulatory Visit: Payer: Self-pay | Admitting: Family Medicine

## 2014-02-24 ENCOUNTER — Encounter: Payer: Self-pay | Admitting: Family Medicine

## 2014-02-24 ENCOUNTER — Ambulatory Visit (INDEPENDENT_AMBULATORY_CARE_PROVIDER_SITE_OTHER): Payer: BC Managed Care – PPO | Admitting: Family Medicine

## 2014-02-24 VITALS — BP 110/60 | HR 64 | Temp 98.3°F | Resp 12 | Ht 63.0 in | Wt 119.0 lb

## 2014-02-24 DIAGNOSIS — A499 Bacterial infection, unspecified: Secondary | ICD-10-CM

## 2014-02-24 DIAGNOSIS — N760 Acute vaginitis: Secondary | ICD-10-CM

## 2014-02-24 DIAGNOSIS — B9689 Other specified bacterial agents as the cause of diseases classified elsewhere: Secondary | ICD-10-CM

## 2014-02-24 DIAGNOSIS — R21 Rash and other nonspecific skin eruption: Secondary | ICD-10-CM

## 2014-02-24 LAB — WET PREP FOR TRICH, YEAST, CLUE
TRICH WET PREP: NONE SEEN
YEAST WET PREP: NONE SEEN

## 2014-02-24 MED ORDER — METRONIDAZOLE 500 MG PO TABS: 500.0000 mg | ORAL_TABLET | Freq: Two times a day (BID) | ORAL | Status: DC

## 2014-02-24 MED ORDER — FLUCONAZOLE 150 MG PO TABS: 150.0000 mg | ORAL_TABLET | Freq: Once | ORAL | Status: DC

## 2014-02-24 NOTE — Progress Notes (Signed)
Patient ID: Shelia Martin, female   DOB: 24-Oct-1990, 23 y.o.   MRN: [id number]   Subjective:    Patient ID: Shelia CowerAerica K Rumer, female    DOB: 24-Oct-1990, 23 y.o.   MRN: [id number]  Patient presents for Vaginal Odor with discharge and red swollen area to inner elbow  Patient here with vaginal odor and discharge for the past couple weeks. She does have a new sexual partner. She's been using a condom. She denies abdominal pain cramping or abnormal bleeding. She's currently on Depo-Provera she gave herself a shot a month ago.  Rash has history of eczema states that she was concerned that M. been causing a rash that she received in her flexural areas of her arms. It looks like more of a bug bite she thought it may be related to her last shot however she has been on Cipro since last year. The lesion was red and she took a picture of it it looks like an insect bite with some surrounding erythema. It resolved on its own but it has returned again since the initial time.   Review Of Systems:  GEN- denies fatigue, fever, weight loss,weakness, recent illness HEENT- denies eye drainage, change in vision, nasal discharge, CVS- denies chest pain, palpitations RESP- denies SOB, cough, wheeze ABD- denies N/V, change in stools, abd pain GU- denies dysuria, hematuria, dribbling, incontinence MSK- denies joint pain, muscle aches, injury Neuro- denies headache, dizziness, syncope, seizure activity       Objective:    BP 110/60  Pulse 64  Temp(Src) 98.3 F (36.8 C) (Oral)  Resp 12  Ht 5\' 3"  (1.6 m)  Wt 119 lb (53.978 kg)  BMI 21.09 kg/m2 GEN- NAD, alert and oriented x3 GU- normal external genitalia, vaginal mucosa pink and moist, cervix visualized no growth, no blood form os, minimal thin clear discharge, no CMT, no ovarian masses, uterus normal size Skin- eczematous rash bilat flexerol areas, few lesions on neck and elbows       Assessment & Plan:      Problem List Items Addressed This  Visit   None    Visit Diagnoses   BV (bacterial vaginosis)    -  Primary    BV seen on wet prep, treat with flagyl, diflucan also given s/p antibiotics, GC pending    Relevant Medications       metroNIDAZOLE (FLAGYL) tablet       fluconazole (DIFLUCAN) tablet 150 mg    Vaginitis and vulvovaginitis        Relevant Orders       GC/chlamydia probe amp, genital       WET PREP FOR TRICH, YEAST, CLUE (Completed)    Rash and nonspecific skin eruption        She has eczema but picture looks like more insect bite and local reaction, will have her monitor, doubt related to depo has been on depo for quite some time,TAC cream to be applied If she gets more bites all over treat for presumptive bed bugs/scabies       Note: This dictation was prepared with Dragon dictation along with smaller phrase technology. Any transcriptional errors that result from this process are unintentional.

## 2014-02-25 LAB — GC/CHLAMYDIA PROBE AMP
CT PROBE, AMP APTIMA: POSITIVE — AB
GC PROBE AMP APTIMA: NEGATIVE

## 2014-02-25 MED ORDER — AZITHROMYCIN 500 MG PO TABS: 1000.0000 mg | ORAL_TABLET | Freq: Once | ORAL | Status: DC

## 2014-03-06 ENCOUNTER — Emergency Department (HOSPITAL_COMMUNITY)
Admission: EM | Admit: 2014-03-06 | Discharge: 2014-03-07 | Disposition: A | Discharge status: Home / Self Care | Payer: BC Managed Care – PPO

## 2014-03-10 ENCOUNTER — Encounter (HOSPITAL_COMMUNITY): Payer: Self-pay | Admitting: Emergency Medicine

## 2014-03-10 ENCOUNTER — Emergency Department (HOSPITAL_COMMUNITY)
Admission: EM | Admit: 2014-03-10 | Discharge: 2014-03-10 | Disposition: A | Discharge status: Home / Self Care | Payer: BC Managed Care – PPO | Attending: Emergency Medicine | Admitting: Emergency Medicine

## 2014-03-10 DIAGNOSIS — R21 Rash and other nonspecific skin eruption: Secondary | ICD-10-CM | POA: Insufficient documentation

## 2014-03-10 DIAGNOSIS — L508 Other urticaria: Secondary | ICD-10-CM | POA: Insufficient documentation

## 2014-03-10 DIAGNOSIS — L509 Urticaria, unspecified: Secondary | ICD-10-CM

## 2014-03-10 MED ORDER — HYDROCORTISONE 1 % EX CREA
TOPICAL_CREAM | CUTANEOUS | Status: DC
Start: 2014-03-10 — End: 2016-05-09

## 2014-03-10 NOTE — Discharge Instructions (Signed)
Hives Hives are itchy, red, swollen areas of the skin. They can vary in size and location on your body. Hives can come and go for hours or several days (acute hives) or for several weeks (chronic hives). Hives do not spread from person to person (noncontagious). They may get worse with scratching, exercise, and emotional stress. CAUSES   Allergic reaction to food, additives, or drugs.  Infections, including the common cold.  Illness, such as vasculitis, lupus, or thyroid disease.  Exposure to sunlight, heat, or cold.  Exercise.  Stress.  Contact with chemicals. SYMPTOMS   Red or white swollen patches on the skin. The patches may change size, shape, and location quickly and repeatedly.  Itching.  Swelling of the hands, feet, and face. This may occur if hives develop deeper in the skin. DIAGNOSIS  Your caregiver can usually tell what is wrong by performing a physical exam. Skin or blood tests may also be done to determine the cause of your hives. In some cases, the cause cannot be determined. TREATMENT  Mild cases usually get better with medicines such as antihistamines. Severe cases may require an emergency epinephrine injection. If the cause of your hives is known, treatment includes avoiding that trigger.  HOME CARE INSTRUCTIONS   Avoid causes that trigger your hives.  Take antihistamines as directed by your caregiver to reduce the severity of your hives. Non-sedating or low-sedating antihistamines are usually recommended. Do not drive while taking an antihistamine.  Take any other medicines prescribed for itching as directed by your caregiver.  Wear loose-fitting clothing.  Keep all follow-up appointments as directed by your caregiver. SEEK MEDICAL CARE IF:   You have persistent or severe itching that is not relieved with medicine.  You have painful or swollen joints. SEEK IMMEDIATE MEDICAL CARE IF:   You have a fever.  Your tongue or lips are swollen.  You have  trouble breathing or swallowing.  You feel tightness in the throat or chest.  You have abdominal pain. These problems may be the first sign of a life-threatening allergic reaction. Call your local emergency services (911 in [place] MAKE SURE YOU:   Understand these instructions.  Will watch your condition.  Will get help right away if you are not doing well or get worse. Document Released: 08/04/2005 Document Revised: 08/09/2013 Document Reviewed: 10/28/2011 Exit[employer] Patient Information 2015 [employer]. This information is not intended to replace advice given to you by your health care provider. Make sure you discuss any questions you have with your health care provider.  

## 2014-03-10 NOTE — ED Provider Notes (Signed)
Medical screening examination/treatment/procedure(s) were performed by non-physician practitioner and as supervising physician I was immediately available for consultation/collaboration.    Suzi RootsKevin E Shinika Estelle, MD 03/10/14 2351

## 2014-03-10 NOTE — ED Provider Notes (Signed)
CSN: [id number]     Arrival date & time 03/10/14  2054 History  This chart was scribed for Fayrene Helper, PA-C working with Suzi Roots, MD by Evon Slack, ED Scribe. This patient was seen in room TR04C/TR04C and the patient's care was started at 9:59 PM.    Chief Complaint  Patient presents with  . Rash   Patient is a 23 y.o. female presenting with rash. The history is provided by the patient. No language interpreter was used.  Rash Associated symptoms: no fever, no headaches, no nausea, no shortness of breath and not vomiting    HPI Comments: Shelia Martin is a 23 y.o. female who presents to the Emergency Department complaining of intermmitent rash located on her bilateral arms onset 2 months. She states that the rash is itchy. She states that taking a hot shower worsens her symptoms. She also states that she uses benadryl cream with no relief. She states that peroxide provide temporary relief. She denies any changes in soaps or detergents. She denies nausea, vomiting, sob, headache, or fever. She states she has hx of eczema.   Past Medical History  Diagnosis Date  . Eczema    History reviewed. No pertinent past surgical history. Family History  Problem Relation Age of Onset  . Diabetes Mother   . Diabetes Father    History  Substance Use Topics  . Smoking status: Never Smoker   . Smokeless tobacco: Not on file  . Alcohol Use: No   OB History   Grav Para Term Preterm Abortions TAB SAB Ect Mult Living   1 1 1       1      Review of Systems  Constitutional: Negative for fever.  Respiratory: Negative for shortness of breath.   Gastrointestinal: Negative for nausea and vomiting.  Skin: Positive for rash.  Neurological: Negative for headaches.   Allergies  Review of patient's allergies indicates no known allergies.  Home Medications   Prior to Admission medications   Not on File   Triage Vitals: BP 124/68  Pulse 69  Temp(Src) 99.1 F (37.3 C) (Oral)  Resp 18   Ht 5\' 5"  (1.651 m)  Wt 110 lb (49.896 kg)  BMI 18.31 kg/m2  SpO2 99%  Breastfeeding? Yes  Physical Exam  Nursing note and vitals reviewed. Constitutional: She is oriented to person, place, and time. She appears well-developed and well-nourished. No distress.  HENT:  Head: Normocephalic and atraumatic.  Eyes: Conjunctivae and EOM are normal.  Neck: Neck supple. No tracheal deviation present.  Cardiovascular: Normal rate.   Pulmonary/Chest: Effort normal. No respiratory distress.  Abdominal: Soft. There is no tenderness.  Musculoskeletal: Normal range of motion.  Neurological: She is alert and oriented to person, place, and time.  Skin: Skin is warm and dry.  Hives noted to antecubital region to the right arm hives noted on left forearm.    No rash in mouth, palms of hand or soles of feet.    Psychiatric: She has a normal mood and affect. Her behavior is normal.    ED Course  Procedures (including critical care time) DIAGNOSTIC STUDIES: Oxygen Saturation is 99% on RA, normal by my interpretation.    COORDINATION OF CARE: 10:06 PM-urticarial hives noted to forearm.  Non infected. NO red flags.  Pt without prior syphilis as her previous RPR is negative.  Discussed treatment plan which includes hydrocortisone cream and follow up with dermatologist  with pt at bedside and pt agreed to plan.  Labs Review Labs Reviewed - No data to display  Imaging Review No results found.   EKG Interpretation None      MDM   Final diagnoses:  Urticarial rash     BP 124/68  Pulse 69  Temp(Src) 99.1 F (37.3 C) (Oral)  Resp 18  Ht 5\' 5"  (1.651 m)  Wt 110 lb (49.896 kg)  BMI 18.31 kg/m2  SpO2 99%  Breastfeeding? Yes  I personally performed the services described in this documentation, which was scribed in my presence. The recorded information has been reviewed and is accurate.       Fayrene HelperBowie Ayanna Gheen, PA-C 03/10/14 2213

## 2014-03-10 NOTE — ED Notes (Signed)
Rash comes up off and on in the bend of your arms, sometimes her back and legs.  Has recently been treated for syphillis (patient is breastfeeding)  Has tried the HiouchiBenedryl but it does not seem to work

## 2014-05-09 ENCOUNTER — Ambulatory Visit: Payer: BC Managed Care – PPO | Admitting: Family Medicine

## 2014-05-16 ENCOUNTER — Ambulatory Visit: Payer: BC Managed Care – PPO | Admitting: Family Medicine

## 2014-06-19 ENCOUNTER — Encounter (HOSPITAL_COMMUNITY): Payer: Self-pay | Admitting: Emergency Medicine

## 2014-09-11 ENCOUNTER — Encounter: Payer: Self-pay | Admitting: Family Medicine

## 2014-10-04 ENCOUNTER — Ambulatory Visit: Payer: Medicaid Other | Admitting: Family Medicine

## 2014-10-10 ENCOUNTER — Encounter: Payer: Self-pay | Admitting: Family Medicine

## 2014-10-17 ENCOUNTER — Ambulatory Visit: Payer: Self-pay | Admitting: Family Medicine

## 2014-10-19 ENCOUNTER — Encounter: Payer: Self-pay | Admitting: Family Medicine

## 2015-01-12 ENCOUNTER — Ambulatory Visit: Payer: Self-pay | Admitting: Family Medicine

## 2015-04-23 ENCOUNTER — Emergency Department (HOSPITAL_COMMUNITY)
Admission: EM | Admit: 2015-04-23 | Discharge: 2015-04-23 | Disposition: A | Discharge status: Home / Self Care | Payer: BLUE CROSS/BLUE SHIELD | Attending: Emergency Medicine | Admitting: Emergency Medicine

## 2015-04-23 ENCOUNTER — Encounter (HOSPITAL_COMMUNITY): Payer: Self-pay | Admitting: Emergency Medicine

## 2015-04-23 DIAGNOSIS — N889 Noninflammatory disorder of cervix uteri, unspecified: Secondary | ICD-10-CM

## 2015-04-23 DIAGNOSIS — R87619 Unspecified abnormal cytological findings in specimens from cervix uteri: Secondary | ICD-10-CM | POA: Diagnosis not present

## 2015-04-23 DIAGNOSIS — Z872 Personal history of diseases of the skin and subcutaneous tissue: Secondary | ICD-10-CM | POA: Insufficient documentation

## 2015-04-23 DIAGNOSIS — Z3202 Encounter for pregnancy test, result negative: Secondary | ICD-10-CM | POA: Diagnosis not present

## 2015-04-23 DIAGNOSIS — Z202 Contact with and (suspected) exposure to infections with a predominantly sexual mode of transmission: Secondary | ICD-10-CM | POA: Diagnosis present

## 2015-04-23 LAB — URINALYSIS, ROUTINE W REFLEX MICROSCOPIC
Bilirubin Urine: NEGATIVE
GLUCOSE, UA: NEGATIVE mg/dL
HGB URINE DIPSTICK: NEGATIVE
Ketones, ur: NEGATIVE mg/dL
Nitrite: NEGATIVE
PH: 7 (ref 5.0–8.0)
Protein, ur: NEGATIVE mg/dL
SPECIFIC GRAVITY, URINE: 1.014 (ref 1.005–1.030)
Urobilinogen, UA: 1 mg/dL (ref 0.0–1.0)

## 2015-04-23 LAB — WET PREP, GENITAL
Trich, Wet Prep: NONE SEEN
Yeast Wet Prep HPF POC: NONE SEEN

## 2015-04-23 LAB — URINE MICROSCOPIC-ADD ON

## 2015-04-23 LAB — PREGNANCY, URINE: Preg Test, Ur: NEGATIVE

## 2015-04-23 NOTE — ED Notes (Signed)
Pelvic cart set up at bedside  

## 2015-04-23 NOTE — ED Notes (Signed)
Lab called for urine pregnancy add-on.

## 2015-04-23 NOTE — ED Notes (Signed)
Declined W/C at D/C and was escorted to lobby by RN. 

## 2015-04-23 NOTE — Discharge Instructions (Signed)
Read the information below.  You may return to the Emergency Department at any time for worsening condition or any new symptoms that concern you. °

## 2015-04-23 NOTE — ED Provider Notes (Signed)
CSN: [id number]     Arrival date & time 04/23/15  1545 History  This chart was scribed for non-physician practitioner working Trixie Dredge PA-C with Rolan Bucco, MD by Lyndel Safe, ED Scribe. This patient was seen in room TR09C/TR09C and the patient's care was started at 4:28 PM.  Chief Complaint  Patient presents with  . SEXUALLY TRANSMITTED DISEASE   The history is provided by the patient. No language interpreter was used.   HPI Comments: Shelia Martin is a 24 y.o. female who presents to the Emergency Department c/o "knot" she found during a shower yesterday located over her inner, left vaginal cavity. Did note episode of abnormal odor while in the bathroom this morning as well.  LNMP was the end of August; denies a chance of pregnancy. She additionally reports intermittent, seldom dyspareunia that she feels more significantly on the left side. Pt is not followed by a GYN. Denies pain to the raised area, vaginal itching or burning, dysuria, urgency, frequency, changes in stool, abdominal pain, nausea or vomiting or CP. Additionally denies abnormal pap smears in the past.   Past Medical History  Diagnosis Date  . Eczema    History reviewed. No pertinent past surgical history. Family History  Problem Relation Age of Onset  . Diabetes Mother   . Diabetes Father    Social History  Substance Use Topics  . Smoking status: Never Smoker   . Smokeless tobacco: None  . Alcohol Use: No   OB History    Gravida Para Term Preterm AB TAB SAB Ectopic Multiple Living   1 1 1       1      Review of Systems  Constitutional: Negative for fever and chills.  Gastrointestinal: Negative for nausea, vomiting, abdominal pain, diarrhea and constipation.  Genitourinary: Positive for dyspareunia. Negative for dysuria, urgency, frequency, vaginal bleeding, vaginal discharge, genital sores, vaginal pain and menstrual problem.  Musculoskeletal: Negative for myalgias.  Skin: Negative for rash.   Allergic/Immunologic: Negative for immunocompromised state.  Hematological: Does not bruise/bleed easily.  Psychiatric/Behavioral: Negative for self-injury.  All other systems reviewed and are negative.   Allergies  Review of patient's allergies indicates no known allergies.  Home Medications   Prior to Admission medications   Medication Sig Start Date End Date Taking? Authorizing Provider  hydrocortisone cream 1 % Apply to affected area 2 times daily as needed.  Avoid use on face 03/10/14   Fayrene Helper, PA-C   BP 130/72 mmHg  Pulse 92  Temp(Src) 98 F (36.7 C) (Oral)  Resp 16  Ht 5\' 6"  (1.676 m)  SpO2 97%  LMP 04/15/2015 (Approximate) Physical Exam  Constitutional: She appears well-developed and well-nourished. No distress.  HENT:  Head: Normocephalic and atraumatic.  Neck: Neck supple.  Cardiovascular: Normal rate.   Pulmonary/Chest: Effort normal.  Abdominal: Soft. She exhibits no distension. There is no tenderness. There is no rebound and no guarding.  Genitourinary: Vagina normal. Uterus is not tender. Cervix exhibits no motion tenderness and no discharge. Right adnexum displays no mass, no tenderness and no fullness. Left adnexum displays no mass, no tenderness and no fullness. No erythema, tenderness or bleeding in the vagina. No foreign body around the vagina. No signs of injury around the vagina. No vaginal discharge found.  Tiny, firm, single nodule on cervix that is palpable but not visible.   Neurological: She is alert.  Skin: She is not diaphoretic.  Nursing note and vitals reviewed.  ED Course  Procedures  DIAGNOSTIC  STUDIES: Oxygen Saturation is 97% on RA, normal by my interpretation.    COORDINATION OF CARE: 4:31 PM Pelvic exam performed with chaperone present throughout entire exam.  4:37 PM Discussed treatment plan which includes to order diagnostic labs to screen for STDs with pt. Will refer to OB GYN. Pt acknowledges and agrees to plan.   Labs  Review Labs Reviewed  WET PREP, GENITAL - Abnormal; Notable for the following:    Clue Cells Wet Prep HPF POC FEW (*)    WBC, Wet Prep HPF POC FEW (*)    All other components within normal limits  URINALYSIS, ROUTINE W REFLEX MICROSCOPIC (NOT AT [facility]) - Abnormal; Notable for the following:    Leukocytes, UA TRACE (*)    All other components within normal limits  URINE MICROSCOPIC-ADD ON - Abnormal; Notable for the following:    Squamous Epithelial / LPF FEW (*)    Bacteria, UA FEW (*)    All other components within normal limits  PREGNANCY, URINE  HIV ANTIBODY (ROUTINE TESTING)  RPR  GC/CHLAMYDIA PROBE AMP ([place]) NOT AT [facility]    Imaging Review No results found. I have personally reviewed and evaluated these images and lab results as part of my medical decision-making.   EKG Interpretation None      MDM   Final diagnoses:  Cervix abnormality    Afebrile, nontoxic patient with abnormal small nodularity on cervix she noticed in the shower.  It is palpable but of uncertain significance.  I did recommend close gyn follow up.  Exam otherwise unremarkable.  No abnormal discharge or odor noted.   D/C home with gyn follow up.  Discussed result, findings, treatment, and follow up  with patient.  Pt given return precautions.  Pt verbalizes understanding and agrees with plan.       I personally performed the services described in this documentation, which was scribed in my presence. The recorded information has been reviewed and is accurate.    [place], PA-C 04/23/15 1742  Rolan Bucco, MD 04/29/15 [phone]

## 2015-04-23 NOTE — ED Notes (Addendum)
Pt from home for eval of "knot" on left labia x2 days, pt states she has been sexually active but has used protection. Pt also reports vaginal odor but denies any vaginal discharge or pain. nad noted. Pt states she wants to be checked for STD

## 2015-04-24 LAB — RPR: RPR: NONREACTIVE

## 2015-04-24 LAB — HIV ANTIBODY (ROUTINE TESTING W REFLEX): HIV Screen 4th Generation wRfx: NONREACTIVE

## 2015-04-24 LAB — GC/CHLAMYDIA PROBE AMP (~~LOC~~) NOT AT ARMC
Chlamydia: NEGATIVE
Neisseria Gonorrhea: NEGATIVE

## 2015-05-03 ENCOUNTER — Encounter: Payer: Self-pay | Admitting: Family Medicine

## 2015-06-01 ENCOUNTER — Encounter: Payer: Self-pay | Admitting: Family Medicine

## 2015-06-01 ENCOUNTER — Ambulatory Visit (INDEPENDENT_AMBULATORY_CARE_PROVIDER_SITE_OTHER): Payer: BLUE CROSS/BLUE SHIELD | Admitting: *Deleted

## 2015-06-01 DIAGNOSIS — Z23 Encounter for immunization: Secondary | ICD-10-CM

## 2015-06-01 NOTE — Progress Notes (Signed)
Patient ID: Shelia Martin, female   DOB: 02-27-1991, 24 y.o.   MRN: [id number]  Patient seen in office for Influenza Vaccination.   Tolerated IM administration well.   Immunization history updated.

## 2015-07-02 ENCOUNTER — Other Ambulatory Visit: Payer: Self-pay | Admitting: Family Medicine

## 2015-07-02 DIAGNOSIS — N889 Noninflammatory disorder of cervix uteri, unspecified: Secondary | ICD-10-CM

## 2015-07-02 DIAGNOSIS — N941 Unspecified dyspareunia: Secondary | ICD-10-CM

## 2015-07-04 ENCOUNTER — Telehealth: Payer: Self-pay | Admitting: *Deleted

## 2015-07-04 NOTE — Telephone Encounter (Signed)
pt scheduled at [facility] on nov 30th at 10:20am,pt needs to arrive 30mins early to fill out ppw. Left message for pt to return call

## 2015-07-05 NOTE — Telephone Encounter (Signed)
Pt called back and aware of appt 

## 2015-07-10 ENCOUNTER — Encounter: Payer: Self-pay | Admitting: Family Medicine

## 2015-10-15 ENCOUNTER — Encounter: Payer: Self-pay | Admitting: Family Medicine

## 2015-12-24 ENCOUNTER — Emergency Department (HOSPITAL_COMMUNITY)
Admission: EM | Admit: 2015-12-24 | Discharge: 2015-12-24 | Disposition: A | Discharge status: Home / Self Care | Payer: BLUE CROSS/BLUE SHIELD | Attending: Emergency Medicine | Admitting: Emergency Medicine

## 2015-12-24 ENCOUNTER — Encounter (HOSPITAL_COMMUNITY): Payer: Self-pay | Admitting: Emergency Medicine

## 2015-12-24 DIAGNOSIS — N72 Inflammatory disease of cervix uteri: Secondary | ICD-10-CM | POA: Insufficient documentation

## 2015-12-24 DIAGNOSIS — Z3202 Encounter for pregnancy test, result negative: Secondary | ICD-10-CM | POA: Diagnosis not present

## 2015-12-24 DIAGNOSIS — B9689 Other specified bacterial agents as the cause of diseases classified elsewhere: Secondary | ICD-10-CM

## 2015-12-24 DIAGNOSIS — Z872 Personal history of diseases of the skin and subcutaneous tissue: Secondary | ICD-10-CM | POA: Diagnosis not present

## 2015-12-24 DIAGNOSIS — N760 Acute vaginitis: Secondary | ICD-10-CM | POA: Diagnosis not present

## 2015-12-24 DIAGNOSIS — R109 Unspecified abdominal pain: Secondary | ICD-10-CM | POA: Diagnosis present

## 2015-12-24 DIAGNOSIS — J029 Acute pharyngitis, unspecified: Secondary | ICD-10-CM | POA: Diagnosis not present

## 2015-12-24 LAB — BASIC METABOLIC PANEL
Anion gap: 10 (ref 5–15)
BUN: 5 mg/dL — ABNORMAL LOW (ref 6–20)
CALCIUM: 9 mg/dL (ref 8.9–10.3)
CO2: 24 mmol/L (ref 22–32)
CREATININE: 0.95 mg/dL (ref 0.44–1.00)
Chloride: 103 mmol/L (ref 101–111)
GFR calc non Af Amer: >60 mL/min (ref >60)
Glucose, Bld: 96 mg/dL (ref 65–99)
Potassium: 4.1 mmol/L (ref 3.5–5.1)
SODIUM: 137 mmol/L (ref 135–145)

## 2015-12-24 LAB — CBC WITH DIFFERENTIAL/PLATELET
BASOS PCT: 1 %
Basophils Absolute: 0 10*3/uL (ref 0.0–0.1)
EOS ABS: 0 10*3/uL (ref 0.0–0.7)
Eosinophils Relative: 0 %
HCT: 38.4 % (ref 36.0–46.0)
HEMOGLOBIN: 13.1 g/dL (ref 12.0–15.0)
Lymphocytes Relative: 20 %
Lymphs Abs: 1 10*3/uL (ref 0.7–4.0)
MCH: 28.2 pg (ref 26.0–34.0)
MCHC: 34.1 g/dL (ref 30.0–36.0)
MCV: 82.6 fL (ref 78.0–100.0)
MONO ABS: 0.5 10*3/uL (ref 0.1–1.0)
MONOS PCT: 11 %
NEUTROS PCT: 68 %
Neutro Abs: 3.6 10*3/uL (ref 1.7–7.7)
Platelets: 151 10*3/uL (ref 150–400)
RBC: 4.65 MIL/uL (ref 3.87–5.11)
RDW: 12.3 % (ref 11.5–15.5)
WBC: 5.2 10*3/uL (ref 4.0–10.5)

## 2015-12-24 LAB — WET PREP, GENITAL
SPERM: NONE SEEN
Trich, Wet Prep: NONE SEEN
Yeast Wet Prep HPF POC: NONE SEEN

## 2015-12-24 LAB — URINE MICROSCOPIC-ADD ON: RBC / HPF: NONE SEEN RBC/hpf (ref 0–5)

## 2015-12-24 LAB — URINALYSIS, ROUTINE W REFLEX MICROSCOPIC
BILIRUBIN URINE: NEGATIVE
GLUCOSE, UA: NEGATIVE mg/dL
HGB URINE DIPSTICK: NEGATIVE
Ketones, ur: 15 mg/dL — AB
Nitrite: NEGATIVE
PH: 5.5 (ref 5.0–8.0)
Protein, ur: NEGATIVE mg/dL
SPECIFIC GRAVITY, URINE: 1.018 (ref 1.005–1.030)

## 2015-12-24 LAB — POC URINE PREG, ED: PREG TEST UR: NEGATIVE

## 2015-12-24 MED ORDER — ONDANSETRON 4 MG PO TBDP: 4.0000 mg | ORAL_TABLET | Freq: Three times a day (TID) | ORAL | Status: AC | PRN

## 2015-12-24 MED ORDER — SODIUM CHLORIDE 0.9 % IV BOLUS (SEPSIS): 1000.0000 mL | Freq: Once | INTRAVENOUS | Status: DC

## 2015-12-24 MED ORDER — LIDOCAINE HCL (PF) 1 % IJ SOLN
INTRAMUSCULAR | Status: AC
  Administered 2015-12-24: 5 mL
  Filled 2015-12-24: qty 5

## 2015-12-24 MED ORDER — ACETAMINOPHEN 325 MG PO TABS
650.0000 mg | ORAL_TABLET | Freq: Once | ORAL | Status: AC
  Administered 2015-12-24: 650 mg via ORAL
  Filled 2015-12-24: qty 2

## 2015-12-24 MED ORDER — METRONIDAZOLE 500 MG PO TABS
2000.0000 mg | ORAL_TABLET | Freq: Once | ORAL | Status: AC
  Administered 2015-12-24: 2000 mg via ORAL
  Filled 2015-12-24: qty 4

## 2015-12-24 MED ORDER — ONDANSETRON HCL 4 MG/2ML IJ SOLN
4.0000 mg | Freq: Once | INTRAMUSCULAR | Status: AC
  Administered 2015-12-24: 4 mg via INTRAVENOUS
  Filled 2015-12-24: qty 2

## 2015-12-24 MED ORDER — DOXYCYCLINE HYCLATE 100 MG PO CAPS: 100.0000 mg | ORAL_CAPSULE | Freq: Two times a day (BID) | ORAL | Status: AC

## 2015-12-24 MED ORDER — AZITHROMYCIN 250 MG PO TABS
1000.0000 mg | ORAL_TABLET | Freq: Once | ORAL | Status: AC
  Administered 2015-12-24: 1000 mg via ORAL
  Filled 2015-12-24: qty 4

## 2015-12-24 MED ORDER — METRONIDAZOLE 500 MG PO TABS: 500.0000 mg | ORAL_TABLET | Freq: Two times a day (BID) | ORAL | Status: AC

## 2015-12-24 MED ORDER — CEFTRIAXONE SODIUM 250 MG IJ SOLR
250.0000 mg | Freq: Once | INTRAMUSCULAR | Status: AC
  Administered 2015-12-24: 250 mg via INTRAMUSCULAR
  Filled 2015-12-24: qty 250

## 2015-12-24 NOTE — Discharge Instructions (Signed)
Bacterial Vaginosis °Bacterial vaginosis is a vaginal infection that occurs when the normal balance of bacteria in the vagina is disrupted. It results from an overgrowth of certain bacteria. This is the most common vaginal infection in women of childbearing age. Treatment is important to prevent complications, especially in pregnant women, as it can cause a premature delivery. °CAUSES  °Bacterial vaginosis is caused by an increase in harmful bacteria that are normally present in smaller amounts in the vagina. Several different kinds of bacteria can cause bacterial vaginosis. However, the reason that the condition develops is not fully understood. °RISK FACTORS °Certain activities or behaviors can put you at an increased risk of developing bacterial vaginosis, including: °· Having a new sex partner or multiple sex partners. °· Douching. °· Using an intrauterine device (IUD) for contraception. °Women do not get bacterial vaginosis from toilet seats, bedding, swimming pools, or contact with objects around them. °SIGNS AND SYMPTOMS  °Some women with bacterial vaginosis have no signs or symptoms. Common symptoms include: °· Grey vaginal discharge. °· A fishlike odor with discharge, especially after sexual intercourse. °· Itching or burning of the vagina and vulva. °· Burning or pain with urination. °DIAGNOSIS  °Your health care provider will take a medical history and examine the vagina for signs of bacterial vaginosis. A sample of vaginal fluid may be taken. Your health care provider will look at this sample under a microscope to check for bacteria and abnormal cells. A vaginal pH test may also be done.  °TREATMENT  °Bacterial vaginosis may be treated with antibiotic medicines. These may be given in the form of a pill or a vaginal cream. A second round of antibiotics may be prescribed if the condition comes back after treatment. Because bacterial vaginosis increases your risk for sexually transmitted diseases, getting  treated can help reduce your risk for chlamydia, gonorrhea, HIV, and herpes. °HOME CARE INSTRUCTIONS  °· Only take over-the-counter or prescription medicines as directed by your health care provider. °· If antibiotic medicine was prescribed, take it as directed. Make sure you finish it even if you start to feel better. °· Tell all sexual partners that you have a vaginal infection. They should see their health care provider and be treated if they have problems, such as a mild rash or itching. °· During treatment, it is important that you follow these instructions: °¨ Avoid sexual activity or use condoms correctly. °¨ Do not douche. °¨ Avoid alcohol as directed by your health care provider. °¨ Avoid breastfeeding as directed by your health care provider. °SEEK MEDICAL CARE IF:  °· Your symptoms are not improving after 3 days of treatment. °· You have increased discharge or pain. °· You have a fever. °MAKE SURE YOU:  °· Understand these instructions. °· Will watch your condition. °· Will get help right away if you are not doing well or get worse. °FOR MORE INFORMATION  °Centers for Disease Control and Prevention, Division of STD Prevention: www.cdc.gov/std °American Sexual Health Association (ASHA): www.ashastd.org  °  °This information is not intended to replace advice given to you by your health care provider. Make sure you discuss any questions you have with your health care provider. °  °Document Released: 08/04/2005 Document Revised: 08/25/2014 Document Reviewed: 03/16/2013 °Elsevier Interactive Patient Education ©2016 [employer] ° °Cervicitis °Cervicitis is a soreness and swelling (inflammation) of the cervix. Your cervix is located at the bottom of your uterus. It opens up to the vagina. °CAUSES  °· Sexually transmitted infections (STIs).   °·   Allergic reaction.   °· Medicines or birth control devices that are put in the vagina.   °· Injury to the cervix.   °· Bacterial infections.   °RISK FACTORS °You are at  greater risk if you: °· Have unprotected sexual intercourse. °· Have sexual intercourse with many partners. °· Began sexual intercourse at an early age. °· Have a history of STIs. °SYMPTOMS  °There may be no symptoms. If symptoms occur, they may include:  °· Gray, white, yellow, or bad-smelling vaginal discharge.   °· Pain or itching of the area outside the vagina.   °· Painful sexual intercourse.   °· Lower abdominal or lower back pain, especially during intercourse.   °· Frequent urination.   °· Abnormal vaginal bleeding between periods, after sexual intercourse, or after menopause.   °· Pressure or a heavy feeling in the pelvis.   °DIAGNOSIS  °Diagnosis is made after a pelvic exam. Other tests may include:  °· Examination of any discharge under a microscope (wet prep).   °· A Pap test.   °TREATMENT  °Treatment will depend on the cause of cervicitis. If it is caused by an STI, both you and your partner will need to be treated. Antibiotic medicines will be given.  °HOME CARE INSTRUCTIONS  °· Do not have sexual intercourse until your health care provider says it is okay.   °· Do not have sexual intercourse until your partner has been treated, if your cervicitis is caused by an STI.   °· Take your antibiotics as directed. Finish them even if you start to feel better.   °SEEK MEDICAL CARE IF: °· Your symptoms come back.   °· You have a fever.   °MAKE SURE YOU:  °· Understand these instructions. °· Will watch your condition. °· Will get help right away if you are not doing well or get worse. °  °This information is not intended to replace advice given to you by your health care provider. Make sure you discuss any questions you have with your health care provider. °  °Document Released: 08/04/2005 Document Revised: 08/09/2013 Document Reviewed: 01/26/2013 °Elsevier Interactive Patient Education ©2016 Else[employer]. ° °

## 2015-12-24 NOTE — ED Notes (Signed)
Pt to ER with complaint of lower abdominal pain. [facility] and was diagnosed with bacterial vaginosis. Was given an antibiotic and states the pain is worse now than before the antibiotic. NAD. Ambulatory at present. Alert x4.

## 2015-12-24 NOTE — ED Provider Notes (Signed)
CSN: [id number]     Arrival date & time 12/24/15  0813 History   First MD Initiated Contact with Patient 12/24/15 [phone]     Chief Complaint  Patient presents with  . Abdominal Pain     (Consider location/radiation/quality/duration/timing/severity/associated sxs/prior Treatment) HPI Patient is a 25 year old female with past medical history of previous sexually transmitted infections he presents with 5 days of clear vaginal discharge in 2 days of lower abdominal pain. Patient reports she developed copious clear discharge on Thursday. She was physically seen at urgent care 2 days ago and was diagnosed with bacterial vaginosis. She was treated with acidophilus for this. However she started taking this medicine she developed lower abdominal pain that is mild and cramping. Denies constipation, diarrhea, nausea or vomiting. She does not think that she is pregnant. She has no prior history of abdominal surgeries. No urinary symptoms including dysuria, frequency or hematuria. Past Medical History  Diagnosis Date  . Eczema    History reviewed. No pertinent past surgical history. Family History  Problem Relation Age of Onset  . Diabetes Mother   . Diabetes Father    Social History  Substance Use Topics  . Smoking status: Never Smoker   . Smokeless tobacco: None  . Alcohol Use: No   OB History    Gravida Para Term Preterm AB TAB SAB Ectopic Multiple Living   1 1 1       1      Review of Systems  Constitutional: Negative for fever and chills.  HENT: Positive for sore throat. Negative for congestion and rhinorrhea.   Eyes: Negative for visual disturbance.  Respiratory: Negative for cough and shortness of breath.   Cardiovascular: Negative for chest pain and palpitations.  Gastrointestinal: Positive for abdominal pain. Negative for nausea, vomiting, diarrhea and constipation.  Genitourinary: Positive for vaginal discharge. Negative for dysuria, urgency, hematuria, vaginal bleeding and vaginal  pain.  Skin: Negative for pallor and rash.  Neurological: Negative for light-headedness and headaches.  Psychiatric/Behavioral: Negative for confusion.      Allergies  Review of patient's allergies indicates no known allergies.  Home Medications   Prior to Admission medications   Medication Sig Start Date End Date Taking? Authorizing Provider  hydrocortisone cream 1 % Apply to affected area 2 times daily as needed.  Avoid use on face 03/10/14   Fayrene HelperBowie Tran, PA-C   BP 113/71 mmHg  Pulse 90  Temp(Src) 98.3 F (36.8 C) (Oral)  Resp 14  SpO2 98% Physical Exam  Constitutional: She is oriented to person, place, and time. She appears well-developed and well-nourished.  HENT:  Head: Normocephalic and atraumatic.  Mouth/Throat: Oropharynx is clear and moist. No oropharyngeal exudate.  Eyes: EOM are normal. Pupils are equal, round, and reactive to light.  Neck: Normal range of motion. Neck supple.  Cardiovascular: Normal rate, regular rhythm, normal heart sounds and intact distal pulses.   Pulmonary/Chest: Effort normal and breath sounds normal. No respiratory distress. She has no wheezes. She has no rales.  Abdominal: She exhibits no distension and no mass (mild suprapubic TTP). There is tenderness (mild suprapubic TTP). There is no rebound and no guarding.  Genitourinary: There is no rash, tenderness or lesion on the right labia. There is no rash, tenderness or lesion on the left labia. Cervix exhibits motion tenderness, discharge and friability. Vaginal discharge found.  Large lesion around   Musculoskeletal: Normal range of motion. She exhibits no edema or tenderness.  Neurological: She is alert and oriented to person, place,  and time.  Skin: Skin is warm and dry. No rash noted.  Psychiatric: She has a normal mood and affect.    ED Course  Procedures (including critical care time) Labs Review Labs Reviewed  WET PREP, GENITAL - Abnormal; Notable for the following:    Clue Cells  Wet Prep HPF POC PRESENT (*)    WBC, Wet Prep HPF POC MANY (*)    All other components within normal limits  URINALYSIS, ROUTINE W REFLEX MICROSCOPIC (NOT AT [facility]) - Abnormal; Notable for the following:    Color, Urine AMBER (*)    Ketones, ur 15 (*)    Leukocytes, UA MODERATE (*)    All other components within normal limits  BASIC METABOLIC PANEL - Abnormal; Notable for the following:    BUN 5 (*)    All other components within normal limits  URINE MICROSCOPIC-ADD ON - Abnormal; Notable for the following:    Squamous Epithelial / LPF 6-30 (*)    Bacteria, UA FEW (*)    All other components within normal limits  CBC WITH DIFFERENTIAL/PLATELET  POC URINE PREG, ED  GC/CHLAMYDIA PROBE AMP ([place]) NOT AT [facility]    Imaging Review No results found. I have personally reviewed and evaluated these images and lab results as part of my medical decision-making.   EKG Interpretation None      MDM   Final diagnoses:  None    25 year old female past medical history of STI presents with vaginal discharge and suprapubic discomfort. She has had 2 days of treatment for bacterial vaginosis with what she calls Acidophilus. On presentation temperature is 100.6, patient is tachycardic but otherwise normal vital signs. Exam significant for suprapubic tenderness to palpation and abnormal cervix on GU exam with a large amount of yellow thin discharge. Patient reports she has been told she had a cervical "mass" in the past but did not follow up at that time. Positive cervical motion tenderness on exam. She is not pregnant. Urine is positive for leuks and small bacteria but a lot of epithelial cells likely a dirty sample. Doubt UTI. Wet prep is positive for bacterial vaginosis. GC/CT cultures pending. CBC and CMP are otherwise unremarkable. Given fever and pelvic exam findings, patient was treated for BV and presumed PID with IM Rocephin, Azithromycin, Flagyl. Discharged with prescriptions for Flagyl and  doxycycline, as well as Zofran. Advised follow-up with GYN for repeat pelvic exam and likely biopsy. Strict return precautions were discussed with patient and she reports understanding of plan.  Discussed with Dr. Jodi Mourning, ED attending.    Isa Rankin, MD 12/24/15 1818  Blane Ohara, MD 12/25/15 [phone]

## 2015-12-25 LAB — GC/CHLAMYDIA PROBE AMP (~~LOC~~) NOT AT ARMC
CHLAMYDIA, DNA PROBE: NEGATIVE
Neisseria Gonorrhea: NEGATIVE

## 2016-05-09 ENCOUNTER — Encounter: Payer: Self-pay | Admitting: Family Medicine

## 2016-05-09 ENCOUNTER — Ambulatory Visit (INDEPENDENT_AMBULATORY_CARE_PROVIDER_SITE_OTHER): Payer: 59 | Admitting: Family Medicine

## 2016-05-09 VITALS — BP 120/68 | HR 82 | Temp 98.0°F | Resp 14 | Ht 63.0 in | Wt 136.0 lb

## 2016-05-09 DIAGNOSIS — Z113 Encounter for screening for infections with a predominantly sexual mode of transmission: Secondary | ICD-10-CM | POA: Diagnosis not present

## 2016-05-09 DIAGNOSIS — Z3201 Encounter for pregnancy test, result positive: Secondary | ICD-10-CM

## 2016-05-09 LAB — WET PREP FOR TRICH, YEAST, CLUE
TRICH WET PREP: NONE SEEN
YEAST WET PREP: NONE SEEN

## 2016-05-09 LAB — PREGNANCY, URINE: PREG TEST UR: POSITIVE — AB

## 2016-05-09 NOTE — Progress Notes (Signed)
   Subjective:    Patient ID: Shelia Martin, female    DOB: May 14, 1991, 25 y.o.   MRN: [id number]  Patient presents for Confirmation of Pregnancy (LMP in July- x2 home preg test positive) and STD Testing Patient here with positive pregnancy test at home 2 days ago. Her last initial. She believes was the end of July maybe around the 19th of 20th which makes her about 9-[redacted] weeks pregnant. This was not a planned pregnancy. She is asked to contemplating abortion. She's not had any significant during sickness or breast tenderness. She was like STD check to be done.    Review Of Systems:  GEN- denies fatigue, fever, weight loss,weakness, recent illness HEENT- denies eye drainage, change in vision, nasal discharge, CVS- denies chest pain, palpitations RESP- denies SOB, cough, wheeze ABD- denies N/V, change in stools, abd pain GU- denies dysuria, hematuria, dribbling, incontinence MSK- denies joint pain, muscle aches, injury Neuro- denies headache, dizziness, syncope, seizure activity       Objective:    BP 120/68 (BP Location: Left Arm, Patient Position: Sitting, Cuff Size: Normal)   Pulse 82   Temp 98 F (36.7 C) (Oral)   Resp 14   Ht 5\' 3"  (1.6 m)   Wt 136 lb (61.7 kg)   LMP 03/10/2016 (Approximate) Comment: regular  BMI 24.09 kg/m  GEN- NAD, alert and oriented x3 CVS- RRR, no murmur RESP-CTAB ABD-NABS,soft,NT,ND GU- normal external genitalia, vaginal mucosa pink and moist, cervix visualized no growt , no blood form os, minimal thin clear discharge, no CMT, no ovarian masses, uterus gravid EXT- No edema         Assessment & Plan:      Problem List Items Addressed This Visit    None    Visit Diagnoses    Positive pregnancy test    -  Primary   Ultrasound for dating , long discussion with pt about her options. She is already single mother but has managed with help of family which is supportive. START prenatal vitamins   Relevant Orders   Pregnancy, urine  (Completed)   Screen for STD (sexually transmitted disease)       Relevant Orders   CBC with Differential/Platelet   HIV antibody   WET PREP FOR TRICH, YEAST, CLUE (Completed)   GC/Chlamydia Probe Amp      Note: This dictation was prepared with Dragon dictation along with smaller phrase technology. Any transcriptional errors that result from this process are unintentional.

## 2016-05-09 NOTE — Patient Instructions (Signed)
Ultrasound to be done for dating Take prenatal vitamins  We will call with lab results F/U pending results

## 2016-05-10 LAB — CBC WITH DIFFERENTIAL/PLATELET
BASOS PCT: 0 %
Basophils Absolute: 0 cells/uL (ref 0–200)
EOS PCT: 3 %
Eosinophils Absolute: 237 cells/uL (ref 15–500)
HCT: 34.2 % — ABNORMAL LOW (ref 35.0–45.0)
Hemoglobin: 11.8 g/dL — ABNORMAL LOW (ref 12.0–15.0)
LYMPHS PCT: 22 %
Lymphs Abs: 1738 cells/uL (ref 850–3900)
MCH: 28.6 pg (ref 27.0–33.0)
MCHC: 34.5 g/dL (ref 32.0–36.0)
MCV: 83 fL (ref 80.0–100.0)
MONOS PCT: 5 %
MPV: 10.8 fL (ref 7.5–12.5)
Monocytes Absolute: 395 cells/uL (ref 200–950)
NEUTROS PCT: 70 %
Neutro Abs: 5530 cells/uL (ref 1500–7800)
PLATELETS: 214 10*3/uL (ref 140–400)
RBC: 4.12 MIL/uL (ref 3.80–5.10)
RDW: 12.9 % (ref 11.0–15.0)
WBC: 7.9 10*3/uL (ref 3.8–10.8)

## 2016-05-10 LAB — HIV ANTIBODY (ROUTINE TESTING W REFLEX): HIV 1&2 Ab, 4th Generation: NONREACTIVE

## 2016-05-12 ENCOUNTER — Encounter: Payer: Self-pay | Admitting: Family Medicine

## 2016-05-12 ENCOUNTER — Other Ambulatory Visit: Payer: Self-pay | Admitting: Family Medicine

## 2016-05-12 DIAGNOSIS — Z3481 Encounter for supervision of other normal pregnancy, first trimester: Secondary | ICD-10-CM

## 2016-05-12 NOTE — Addendum Note (Signed)
Addended by: Milinda AntisURHAM, KAWANTA F on: 05/12/2016 01:09 PM   Modules accepted: Orders

## 2016-05-13 ENCOUNTER — Other Ambulatory Visit: Payer: Self-pay | Admitting: Family Medicine

## 2016-05-13 ENCOUNTER — Ambulatory Visit (HOSPITAL_COMMUNITY)
Admission: RE | Admit: 2016-05-13 | Discharge: 2016-05-13 | Disposition: A | Discharge status: Home / Self Care | Payer: BLUE CROSS/BLUE SHIELD | Source: Ambulatory Visit | Attending: Family Medicine | Admitting: Family Medicine

## 2016-05-13 DIAGNOSIS — Z3A12 12 weeks gestation of pregnancy: Secondary | ICD-10-CM | POA: Diagnosis not present

## 2016-05-13 DIAGNOSIS — Z36 Encounter for antenatal screening of mother: Secondary | ICD-10-CM | POA: Insufficient documentation

## 2016-05-13 DIAGNOSIS — Z3481 Encounter for supervision of other normal pregnancy, first trimester: Secondary | ICD-10-CM

## 2016-05-13 DIAGNOSIS — O30002 Twin pregnancy, unspecified number of placenta and unspecified number of amniotic sacs, second trimester: Secondary | ICD-10-CM

## 2016-05-13 DIAGNOSIS — Z3201 Encounter for pregnancy test, result positive: Secondary | ICD-10-CM

## 2016-05-13 LAB — GC/CHLAMYDIA PROBE AMP
CT Probe RNA: NOT DETECTED
GC Probe RNA: NOT DETECTED

## 2016-05-13 NOTE — Progress Notes (Signed)
Twin gestatation, discussed with pt, refer to OB/GYN She is on prenatal vitamins

## 2016-05-23 ENCOUNTER — Telehealth: Payer: Self-pay | Admitting: Family Medicine

## 2016-05-23 NOTE — Telephone Encounter (Signed)
Could we please call her around 1230 if possible, she goes to lunch then, she would like to know what she needs to do after finding out she was pregnant, she said was not told to f/u or any instruction  [phone]

## 2016-05-23 NOTE — Telephone Encounter (Signed)
Call placed to patient.   Advised that referral has been placed and coordinator will contact once appointment can be scheduled.

## 2016-05-30 ENCOUNTER — Encounter: Payer: Self-pay | Admitting: Family Medicine

## 2016-06-30 ENCOUNTER — Encounter (HOSPITAL_COMMUNITY): Payer: Self-pay | Admitting: *Deleted

## 2016-06-30 ENCOUNTER — Inpatient Hospital Stay (HOSPITAL_COMMUNITY)
Admission: AD | Admit: 2016-06-30 | Discharge: 2016-06-30 | Disposition: A | Discharge status: Home / Self Care | Payer: 59 | Source: Ambulatory Visit | Attending: Obstetrics | Admitting: Obstetrics

## 2016-06-30 DIAGNOSIS — Z3A19 19 weeks gestation of pregnancy: Secondary | ICD-10-CM | POA: Insufficient documentation

## 2016-06-30 DIAGNOSIS — O26892 Other specified pregnancy related conditions, second trimester: Secondary | ICD-10-CM | POA: Diagnosis not present

## 2016-06-30 DIAGNOSIS — M549 Dorsalgia, unspecified: Secondary | ICD-10-CM

## 2016-06-30 DIAGNOSIS — O30002 Twin pregnancy, unspecified number of placenta and unspecified number of amniotic sacs, second trimester: Secondary | ICD-10-CM

## 2016-06-30 DIAGNOSIS — O9989 Other specified diseases and conditions complicating pregnancy, childbirth and the puerperium: Secondary | ICD-10-CM

## 2016-06-30 LAB — URINALYSIS, ROUTINE W REFLEX MICROSCOPIC
Bilirubin Urine: NEGATIVE
GLUCOSE, UA: NEGATIVE mg/dL
KETONES UR: NEGATIVE mg/dL
LEUKOCYTES UA: NEGATIVE
NITRITE: NEGATIVE
PH: 6 (ref 5.0–8.0)
PROTEIN: NEGATIVE mg/dL
Specific Gravity, Urine: 1.015 (ref 1.005–1.030)

## 2016-06-30 LAB — URINE MICROSCOPIC-ADD ON

## 2016-06-30 NOTE — Discharge Instructions (Signed)

## 2016-06-30 NOTE — MAU Provider Note (Signed)
  History     CSN: [id number]  Arrival date and time: 06/30/16 [id number]   First Provider Initiated Contact with Patient 06/30/16 2046      Chief Complaint  Patient presents with  . Back Pain   Shelia Martin is a 25 y.o. G2P1001 at 1370w2d who presents today with back pain. She has had the pain off and on for about a week. She wants to know what she can do at home to manage the pain.    Back Pain  This is a new problem. The current episode started yesterday. The problem occurs intermittently. The problem is unchanged. The pain is present in the lumbar spine. Radiates to: if I lay on one side I will feel it in that hip.  The pain is at a severity of 5/10. Pertinent negatives include no abdominal pain, dysuria or fever. Risk factors include pregnancy. She has tried nothing for the symptoms.   Past Medical History:  Diagnosis Date  . Eczema     Past Surgical History:  Procedure Laterality Date  . NO PAST SURGERIES      Family History  Problem Relation Age of Onset  . Diabetes Mother   . Diabetes Father     Social History  Substance Use Topics  . Smoking status: Never Smoker  . Smokeless tobacco: Never Used  . Alcohol use No    Allergies: No Known Allergies  No prescriptions prior to admission.    Review of Systems  Constitutional: Negative for chills and fever.  Gastrointestinal: Negative for abdominal pain, constipation, diarrhea, nausea and vomiting.  Genitourinary: Negative for dysuria, frequency and urgency.  Musculoskeletal: Positive for back pain.   Physical Exam   Blood pressure 114/59, pulse 61, temperature 97.8 F (36.6 C), temperature source Oral, resp. rate 16, last menstrual period 03/10/2016, currently breastfeeding.  Physical Exam  Nursing note and vitals reviewed. Constitutional: She is oriented to person, place, and time. She appears well-developed and well-nourished. No distress.  HENT:  Head: Normocephalic.  Cardiovascular: Normal rate.    Respiratory: Effort normal.  GI: Soft. There is no tenderness. There is no rebound.  Genitourinary:  Genitourinary Comments: No CVA tenderness   Neurological: She is alert and oriented to person, place, and time.  Skin: Skin is warm and dry.  Psychiatric: She has a normal mood and affect.    MAU Course  Procedures  MDM 2059: D/W Dr. Chestine Sporelark, ok for DC home, and FU in the office tomorrow.   Assessment and Plan   1. Back pain affecting pregnancy in second trimester   2. Twin gestation in second trimester, unspecified multiple gestation type    DC home Comfort measures reviewed  2nd Trimester precautions  Fetal kick counts RX: none  Return to MAU as needed FU with OB as planned  Follow-up Information    [facility] Lizabeth LeydenGEFFEL CLARK, MD Follow up.   Specialty:  Obstetrics Contact information: [address] [place] [address] [phone]              Tawnya CrookHogan, Normagene Harvie Donovan 06/30/2016, 8:48 PM

## 2016-06-30 NOTE — MAU Note (Signed)
Pt C/O lower back pain - is constant today, was intermittent previously.  Twin pregnancy, denies bleeding, has occasional abd pain.  Denies dysuria, vomiting or diarrhea.

## 2016-07-01 ENCOUNTER — Other Ambulatory Visit: Payer: Self-pay | Admitting: Obstetrics and Gynecology

## 2016-07-01 DIAGNOSIS — O30049 Twin pregnancy, dichorionic/diamniotic, unspecified trimester: Secondary | ICD-10-CM | POA: Insufficient documentation

## 2016-07-01 DIAGNOSIS — O0930 Supervision of pregnancy with insufficient antenatal care, unspecified trimester: Secondary | ICD-10-CM | POA: Insufficient documentation

## 2016-07-01 DIAGNOSIS — D573 Sickle-cell trait: Secondary | ICD-10-CM | POA: Insufficient documentation

## 2016-07-01 LAB — OB RESULTS CONSOLE ABO/RH: RH Type: POSITIVE

## 2016-07-01 LAB — OB RESULTS CONSOLE RUBELLA ANTIBODY, IGM: RUBELLA: IMMUNE

## 2016-07-01 LAB — OB RESULTS CONSOLE GC/CHLAMYDIA
Chlamydia: NEGATIVE
GC PROBE AMP, GENITAL: NEGATIVE

## 2016-07-01 LAB — OB RESULTS CONSOLE RPR: RPR: NONREACTIVE

## 2016-07-01 LAB — OB RESULTS CONSOLE ANTIBODY SCREEN: ANTIBODY SCREEN: NEGATIVE

## 2016-07-01 LAB — OB RESULTS CONSOLE GBS: GBS: POSITIVE

## 2016-07-01 LAB — OB RESULTS CONSOLE HIV ANTIBODY (ROUTINE TESTING): HIV: NONREACTIVE

## 2016-07-01 LAB — OB RESULTS CONSOLE HEPATITIS B SURFACE ANTIGEN: Hepatitis B Surface Ag: NEGATIVE

## 2016-07-02 LAB — CYTOLOGY - PAP

## 2016-07-03 DIAGNOSIS — Z3490 Encounter for supervision of normal pregnancy, unspecified, unspecified trimester: Secondary | ICD-10-CM | POA: Insufficient documentation

## 2016-08-30 ENCOUNTER — Other Ambulatory Visit: Payer: Self-pay

## 2016-09-19 ENCOUNTER — Encounter: Payer: Self-pay | Admitting: Family Medicine

## 2016-09-26 ENCOUNTER — Other Ambulatory Visit (HOSPITAL_COMMUNITY): Payer: Self-pay | Admitting: Obstetrics and Gynecology

## 2016-09-26 DIAGNOSIS — O30049 Twin pregnancy, dichorionic/diamniotic, unspecified trimester: Secondary | ICD-10-CM

## 2016-10-17 ENCOUNTER — Encounter (HOSPITAL_COMMUNITY): Payer: Self-pay | Admitting: Obstetrics & Gynecology

## 2016-10-17 ENCOUNTER — Other Ambulatory Visit (HOSPITAL_COMMUNITY): Payer: Self-pay | Admitting: Obstetrics & Gynecology

## 2016-10-17 DIAGNOSIS — Z3689 Encounter for other specified antenatal screening: Secondary | ICD-10-CM

## 2016-10-17 DIAGNOSIS — Z3A35 35 weeks gestation of pregnancy: Secondary | ICD-10-CM

## 2016-10-21 ENCOUNTER — Ambulatory Visit (HOSPITAL_COMMUNITY)
Admission: RE | Admit: 2016-10-21 | Discharge: 2016-10-21 | Disposition: A | Discharge status: Home / Self Care | Payer: Medicaid Other | Source: Ambulatory Visit | Attending: Obstetrics & Gynecology | Admitting: Obstetrics & Gynecology

## 2016-10-21 ENCOUNTER — Other Ambulatory Visit (HOSPITAL_COMMUNITY): Payer: Self-pay | Admitting: Obstetrics & Gynecology

## 2016-10-21 ENCOUNTER — Encounter (HOSPITAL_COMMUNITY): Payer: Self-pay

## 2016-10-21 ENCOUNTER — Other Ambulatory Visit (HOSPITAL_COMMUNITY): Payer: Self-pay | Admitting: *Deleted

## 2016-10-21 DIAGNOSIS — O365932 Maternal care for other known or suspected poor fetal growth, third trimester, fetus 2: Secondary | ICD-10-CM | POA: Insufficient documentation

## 2016-10-21 DIAGNOSIS — Z3A35 35 weeks gestation of pregnancy: Secondary | ICD-10-CM | POA: Diagnosis not present

## 2016-10-21 DIAGNOSIS — O30043 Twin pregnancy, dichorionic/diamniotic, third trimester: Secondary | ICD-10-CM | POA: Diagnosis present

## 2016-10-21 DIAGNOSIS — Z862 Personal history of diseases of the blood and blood-forming organs and certain disorders involving the immune mechanism: Secondary | ICD-10-CM | POA: Diagnosis not present

## 2016-10-21 DIAGNOSIS — O403XX2 Polyhydramnios, third trimester, fetus 2: Secondary | ICD-10-CM | POA: Insufficient documentation

## 2016-10-21 DIAGNOSIS — Z363 Encounter for antenatal screening for malformations: Secondary | ICD-10-CM | POA: Diagnosis not present

## 2016-10-21 DIAGNOSIS — O365992 Maternal care for other known or suspected poor fetal growth, unspecified trimester, fetus 2: Secondary | ICD-10-CM

## 2016-10-21 DIAGNOSIS — Z3689 Encounter for other specified antenatal screening: Secondary | ICD-10-CM

## 2016-10-24 ENCOUNTER — Other Ambulatory Visit: Payer: Self-pay | Admitting: Obstetrics & Gynecology

## 2016-10-28 ENCOUNTER — Ambulatory Visit (HOSPITAL_COMMUNITY)
Admission: RE | Admit: 2016-10-28 | Discharge: 2016-10-28 | Disposition: A | Discharge status: Home / Self Care | Payer: Medicaid Other | Source: Ambulatory Visit | Attending: Obstetrics & Gynecology | Admitting: Obstetrics & Gynecology

## 2016-10-28 ENCOUNTER — Other Ambulatory Visit (HOSPITAL_COMMUNITY): Payer: Self-pay | Admitting: Maternal and Fetal Medicine

## 2016-10-28 ENCOUNTER — Encounter (HOSPITAL_COMMUNITY): Payer: Self-pay

## 2016-10-28 ENCOUNTER — Ambulatory Visit (HOSPITAL_COMMUNITY): Payer: 59

## 2016-10-28 ENCOUNTER — Encounter (HOSPITAL_COMMUNITY): Payer: 59

## 2016-10-28 ENCOUNTER — Other Ambulatory Visit: Payer: Self-pay | Admitting: Obstetrics and Gynecology

## 2016-10-28 DIAGNOSIS — O403XX2 Polyhydramnios, third trimester, fetus 2: Secondary | ICD-10-CM

## 2016-10-28 DIAGNOSIS — Z3A36 36 weeks gestation of pregnancy: Secondary | ICD-10-CM

## 2016-10-28 DIAGNOSIS — O30043 Twin pregnancy, dichorionic/diamniotic, third trimester: Secondary | ICD-10-CM

## 2016-10-28 DIAGNOSIS — O365992 Maternal care for other known or suspected poor fetal growth, unspecified trimester, fetus 2: Secondary | ICD-10-CM

## 2016-10-28 DIAGNOSIS — Z862 Personal history of diseases of the blood and blood-forming organs and certain disorders involving the immune mechanism: Secondary | ICD-10-CM | POA: Insufficient documentation

## 2016-10-28 DIAGNOSIS — O365932 Maternal care for other known or suspected poor fetal growth, third trimester, fetus 2: Secondary | ICD-10-CM | POA: Insufficient documentation

## 2016-10-30 ENCOUNTER — Encounter (HOSPITAL_COMMUNITY)
Admission: RE | Admit: 2016-10-30 | Discharge: 2016-10-30 | Disposition: A | Discharge status: Home / Self Care | Payer: Medicaid Other | Source: Ambulatory Visit

## 2016-10-30 ENCOUNTER — Ambulatory Visit (HOSPITAL_COMMUNITY): Payer: 59

## 2016-10-30 ENCOUNTER — Encounter (HOSPITAL_COMMUNITY): Payer: 59

## 2016-10-30 ENCOUNTER — Encounter (HOSPITAL_COMMUNITY): Payer: Self-pay

## 2016-10-30 NOTE — Patient Instructions (Signed)
20 Barbara Cowererica K Villarin  10/30/2016   Your procedure is scheduled on:  10/31/2016  [facility] at 3:15 PM.  Pick up the phone at the desk and dial [phone].   Call this number if you have problems the morning of surgery: [phone]   Remember:   Do not eat food:After Midnight.  Do not drink clear liquids: 6 Hours before arrival.  Take these medicines the morning of surgery with A SIP OF WATER: none   Do not wear jewelry, make-up or nail polish.  Do not wear lotions, powders, or perfumes. Do not wear deodorant.  Do not shave 48 hours prior to surgery.  Do not bring valuables to the hospital.  [facility] is not   responsible for any belongings or valuables brought to the hospital.  Contacts, dentures or bridgework may not be worn into surgery.  Leave suitcase in the car. After surgery it may be brought to your room.  For patients admitted to the hospital, checkout time is 11:00 AM the day of              discharge.   Patients discharged the day of surgery will not be allowed to drive             home.  Name and phone number of your driver: na  Special Instructions:   N/A   Please read over the following fact sheets that you were given:   Surgical Site Infection Prevention

## 2016-10-30 NOTE — Pre-Procedure Instructions (Signed)
Phone visit completed.  Pt unable to come for preop visit.  Instructions given over the pone with verbal feedback.  Email of instructions sent with pt permission to email address provided in EMR.

## 2016-10-31 ENCOUNTER — Encounter (HOSPITAL_COMMUNITY)
Admission: RE | Disposition: A | Discharge status: Home / Self Care | Payer: Self-pay | Source: Ambulatory Visit | Attending: Obstetrics and Gynecology

## 2016-10-31 ENCOUNTER — Inpatient Hospital Stay (HOSPITAL_COMMUNITY)
Admission: RE | Admit: 2016-10-31 | Discharge: 2016-11-03 | DRG: 765 | Disposition: A | Discharge status: Home / Self Care | Payer: Medicaid Other | Source: Ambulatory Visit | Attending: Obstetrics and Gynecology | Admitting: Obstetrics and Gynecology

## 2016-10-31 ENCOUNTER — Inpatient Hospital Stay (HOSPITAL_COMMUNITY): Payer: Medicaid Other | Admitting: Certified Registered Nurse Anesthetist

## 2016-10-31 ENCOUNTER — Encounter (HOSPITAL_COMMUNITY): Payer: Self-pay | Admitting: *Deleted

## 2016-10-31 DIAGNOSIS — O365932 Maternal care for other known or suspected poor fetal growth, third trimester, fetus 2: Secondary | ICD-10-CM | POA: Diagnosis present

## 2016-10-31 DIAGNOSIS — O329XX0 Maternal care for malpresentation of fetus, unspecified, not applicable or unspecified: Secondary | ICD-10-CM

## 2016-10-31 DIAGNOSIS — Z3A37 37 weeks gestation of pregnancy: Secondary | ICD-10-CM | POA: Diagnosis not present

## 2016-10-31 DIAGNOSIS — O30043 Twin pregnancy, dichorionic/diamniotic, third trimester: Secondary | ICD-10-CM | POA: Diagnosis present

## 2016-10-31 DIAGNOSIS — O321XX2 Maternal care for breech presentation, fetus 2: Secondary | ICD-10-CM | POA: Diagnosis present

## 2016-10-31 DIAGNOSIS — O403XX2 Polyhydramnios, third trimester, fetus 2: Secondary | ICD-10-CM | POA: Diagnosis present

## 2016-10-31 DIAGNOSIS — O99824 Streptococcus B carrier state complicating childbirth: Secondary | ICD-10-CM | POA: Diagnosis present

## 2016-10-31 DIAGNOSIS — O3090 Multiple gestation, unspecified, unspecified trimester: Secondary | ICD-10-CM | POA: Diagnosis present

## 2016-10-31 DIAGNOSIS — O321XX1 Maternal care for breech presentation, fetus 1: Secondary | ICD-10-CM | POA: Diagnosis present

## 2016-10-31 DIAGNOSIS — O3093 Multiple gestation, unspecified, third trimester: Secondary | ICD-10-CM

## 2016-10-31 LAB — CBC
HEMATOCRIT: 30.5 % — AB (ref 36.0–46.0)
Hemoglobin: 10.4 g/dL — ABNORMAL LOW (ref 12.0–15.0)
MCH: 25.7 pg — ABNORMAL LOW (ref 26.0–34.0)
MCHC: 34.1 g/dL (ref 30.0–36.0)
MCV: 75.5 fL — ABNORMAL LOW (ref 78.0–100.0)
PLATELETS: 148 10*3/uL — AB (ref 150–400)
RBC: 4.04 MIL/uL (ref 3.87–5.11)
RDW: 13.9 % (ref 11.5–15.5)
WBC: 6.5 10*3/uL (ref 4.0–10.5)

## 2016-10-31 LAB — TYPE AND SCREEN
ABO/RH(D): O POS
Antibody Screen: NEGATIVE

## 2016-10-31 SURGERY — Surgical Case
Anesthesia: Spinal | Wound class: Clean Contaminated

## 2016-10-31 MED ORDER — TETANUS-DIPHTH-ACELL PERTUSSIS 5-2.5-18.5 LF-MCG/0.5 IM SUSP: 0.5000 mL | Freq: Once | INTRAMUSCULAR | Status: DC

## 2016-10-31 MED ORDER — OXYTOCIN 10 UNIT/ML IJ SOLN
INTRAVENOUS | Status: DC | PRN
  Administered 2016-10-31: 40 [IU] via INTRAVENOUS

## 2016-10-31 MED ORDER — IBUPROFEN 600 MG PO TABS
600.0000 mg | ORAL_TABLET | Freq: Four times a day (QID) | ORAL | Status: DC
  Administered 2016-11-01 – 2016-11-03 (×10): 600 mg via ORAL
  Filled 2016-10-31 (×10): qty 1

## 2016-10-31 MED ORDER — PRENATAL MULTIVITAMIN CH
1.0000 | ORAL_TABLET | Freq: Every day | ORAL | Status: DC
  Administered 2016-11-01 – 2016-11-03 (×3): 1 via ORAL
  Filled 2016-10-31 (×3): qty 1

## 2016-10-31 MED ORDER — DIPHENHYDRAMINE HCL 25 MG PO CAPS: 25.0000 mg | ORAL_CAPSULE | ORAL | Status: DC | PRN

## 2016-10-31 MED ORDER — FENTANYL CITRATE (PF) 100 MCG/2ML IJ SOLN
INTRAMUSCULAR | Status: DC | PRN
  Administered 2016-10-31: 10 ug via INTRATHECAL

## 2016-10-31 MED ORDER — ONDANSETRON HCL 4 MG/2ML IJ SOLN
INTRAMUSCULAR | Status: AC
  Filled 2016-10-31: qty 2

## 2016-10-31 MED ORDER — DEXTROSE 5 % IV SOLN: 1.0000 ug/kg/h | INTRAVENOUS | Status: DC | PRN

## 2016-10-31 MED ORDER — KETOROLAC TROMETHAMINE 30 MG/ML IJ SOLN
INTRAMUSCULAR | Status: AC
  Filled 2016-10-31: qty 1

## 2016-10-31 MED ORDER — NALBUPHINE HCL 10 MG/ML IJ SOLN
5.0000 mg | INTRAMUSCULAR | Status: DC | PRN
  Administered 2016-10-31: 5 mg via INTRAVENOUS
  Filled 2016-10-31: qty 1

## 2016-10-31 MED ORDER — FENTANYL CITRATE (PF) 100 MCG/2ML IJ SOLN
INTRAMUSCULAR | Status: AC
  Filled 2016-10-31: qty 2

## 2016-10-31 MED ORDER — COCONUT OIL OIL: 1.0000 "application " | TOPICAL_OIL | Status: DC | PRN

## 2016-10-31 MED ORDER — KETOROLAC TROMETHAMINE 30 MG/ML IJ SOLN: 30.0000 mg | Freq: Four times a day (QID) | INTRAMUSCULAR | Status: DC | PRN

## 2016-10-31 MED ORDER — SENNOSIDES-DOCUSATE SODIUM 8.6-50 MG PO TABS
2.0000 | ORAL_TABLET | ORAL | Status: DC
  Administered 2016-11-02 – 2016-11-03 (×2): 2 via ORAL
  Filled 2016-10-31 (×2): qty 2

## 2016-10-31 MED ORDER — ONDANSETRON HCL 4 MG/2ML IJ SOLN: 4.0000 mg | Freq: Three times a day (TID) | INTRAMUSCULAR | Status: DC | PRN

## 2016-10-31 MED ORDER — NALBUPHINE HCL 10 MG/ML IJ SOLN
5.0000 mg | Freq: Once | INTRAMUSCULAR | Status: DC | PRN
Start: 2016-10-31 — End: 2016-11-03

## 2016-10-31 MED ORDER — SODIUM BICARBONATE 8.4 % IV SOLN
INTRAVENOUS | Status: DC | PRN
  Administered 2016-10-31: 18:00:00 via EPIDURAL

## 2016-10-31 MED ORDER — PHENYLEPHRINE 8 MG IN D5W 100 ML (0.08MG/ML) PREMIX OPTIME
INJECTION | INTRAVENOUS | Status: AC
  Filled 2016-10-31: qty 100

## 2016-10-31 MED ORDER — SIMETHICONE 80 MG PO CHEW
80.0000 mg | CHEWABLE_TABLET | Freq: Three times a day (TID) | ORAL | Status: DC
  Administered 2016-11-01 – 2016-11-03 (×5): 80 mg via ORAL
  Filled 2016-10-31 (×6): qty 1

## 2016-10-31 MED ORDER — MORPHINE SULFATE (PF) 0.5 MG/ML IJ SOLN
INTRAMUSCULAR | Status: AC
  Filled 2016-10-31: qty 10

## 2016-10-31 MED ORDER — DIPHENHYDRAMINE HCL 50 MG/ML IJ SOLN: 12.5000 mg | INTRAMUSCULAR | Status: DC | PRN

## 2016-10-31 MED ORDER — DIBUCAINE 1 % RE OINT: 1.0000 "application " | TOPICAL_OINTMENT | RECTAL | Status: DC | PRN

## 2016-10-31 MED ORDER — METOCLOPRAMIDE HCL 5 MG/ML IJ SOLN: 10.0000 mg | Freq: Once | INTRAMUSCULAR | Status: DC | PRN

## 2016-10-31 MED ORDER — NALBUPHINE HCL 10 MG/ML IJ SOLN: 5.0000 mg | Freq: Once | INTRAMUSCULAR | Status: DC | PRN

## 2016-10-31 MED ORDER — PHENYLEPHRINE 8 MG IN D5W 100 ML (0.08MG/ML) PREMIX OPTIME
INJECTION | INTRAVENOUS | Status: DC | PRN
  Administered 2016-10-31: 60 ug/min via INTRAVENOUS

## 2016-10-31 MED ORDER — LACTATED RINGERS IV SOLN
INTRAVENOUS | Status: DC
  Administered 2016-11-01: 04:00:00 via INTRAVENOUS

## 2016-10-31 MED ORDER — ZOLPIDEM TARTRATE 5 MG PO TABS: 5.0000 mg | ORAL_TABLET | Freq: Every evening | ORAL | Status: DC | PRN

## 2016-10-31 MED ORDER — OXYCODONE-ACETAMINOPHEN 5-325 MG PO TABS
2.0000 | ORAL_TABLET | ORAL | Status: DC | PRN
  Administered 2016-11-01 – 2016-11-02 (×2): 2 via ORAL
  Filled 2016-10-31 (×2): qty 2

## 2016-10-31 MED ORDER — ONDANSETRON HCL 4 MG/2ML IJ SOLN
INTRAMUSCULAR | Status: DC | PRN
  Administered 2016-10-31: 4 mg via INTRAVENOUS

## 2016-10-31 MED ORDER — CEFAZOLIN SODIUM-DEXTROSE 2-4 GM/100ML-% IV SOLN
INTRAVENOUS | Status: AC
  Filled 2016-10-31: qty 100

## 2016-10-31 MED ORDER — OXYTOCIN 10 UNIT/ML IJ SOLN
INTRAMUSCULAR | Status: AC
  Filled 2016-10-31: qty 4

## 2016-10-31 MED ORDER — LACTATED RINGERS IV SOLN
INTRAVENOUS | Status: DC
  Administered 2016-10-31 (×2): via INTRAVENOUS

## 2016-10-31 MED ORDER — SIMETHICONE 80 MG PO CHEW: 80.0000 mg | CHEWABLE_TABLET | ORAL | Status: DC | PRN

## 2016-10-31 MED ORDER — BUPIVACAINE IN DEXTROSE 0.75-8.25 % IT SOLN
INTRATHECAL | Status: DC | PRN
  Administered 2016-10-31: 12 mL via INTRATHECAL

## 2016-10-31 MED ORDER — GLYCOPYRROLATE 0.2 MG/ML IJ SOLN
INTRAMUSCULAR | Status: DC | PRN
  Administered 2016-10-31: 0.2 mg via INTRAVENOUS

## 2016-10-31 MED ORDER — SCOPOLAMINE 1 MG/3DAYS TD PT72
MEDICATED_PATCH | TRANSDERMAL | Status: AC
  Filled 2016-10-31: qty 1

## 2016-10-31 MED ORDER — SODIUM CHLORIDE 0.9% FLUSH: 3.0000 mL | INTRAVENOUS | Status: DC | PRN

## 2016-10-31 MED ORDER — FENTANYL CITRATE (PF) 100 MCG/2ML IJ SOLN: 25.0000 ug | INTRAMUSCULAR | Status: DC | PRN

## 2016-10-31 MED ORDER — MENTHOL 3 MG MT LOZG: 1.0000 | LOZENGE | OROMUCOSAL | Status: DC | PRN

## 2016-10-31 MED ORDER — NALOXONE HCL 0.4 MG/ML IJ SOLN: 0.4000 mg | INTRAMUSCULAR | Status: DC | PRN

## 2016-10-31 MED ORDER — NALBUPHINE HCL 10 MG/ML IJ SOLN: 5.0000 mg | INTRAMUSCULAR | Status: DC | PRN

## 2016-10-31 MED ORDER — DIPHENHYDRAMINE HCL 25 MG PO CAPS: 25.0000 mg | ORAL_CAPSULE | Freq: Four times a day (QID) | ORAL | Status: DC | PRN

## 2016-10-31 MED ORDER — ACETAMINOPHEN 325 MG PO TABS
650.0000 mg | ORAL_TABLET | ORAL | Status: DC | PRN
  Administered 2016-11-01 – 2016-11-02 (×3): 650 mg via ORAL
  Filled 2016-10-31 (×3): qty 2

## 2016-10-31 MED ORDER — SIMETHICONE 80 MG PO CHEW
80.0000 mg | CHEWABLE_TABLET | ORAL | Status: DC
  Administered 2016-11-02 – 2016-11-03 (×2): 80 mg via ORAL
  Filled 2016-10-31 (×2): qty 1

## 2016-10-31 MED ORDER — LACTATED RINGERS IV SOLN
INTRAVENOUS | Status: DC | PRN
  Administered 2016-10-31: 18:00:00 via INTRAVENOUS

## 2016-10-31 MED ORDER — CEFAZOLIN SODIUM-DEXTROSE 2-4 GM/100ML-% IV SOLN
2.0000 g | Freq: Once | INTRAVENOUS | Status: AC
  Administered 2016-10-31: 2 g via INTRAVENOUS
  Filled 2016-10-31: qty 100

## 2016-10-31 MED ORDER — OXYTOCIN 40 UNITS IN LACTATED RINGERS INFUSION - SIMPLE MED: 2.5000 [IU]/h | INTRAVENOUS | Status: AC

## 2016-10-31 MED ORDER — WITCH HAZEL-GLYCERIN EX PADS: 1.0000 "application " | MEDICATED_PAD | CUTANEOUS | Status: DC | PRN

## 2016-10-31 MED ORDER — SODIUM CHLORIDE 0.9 % IR SOLN
Status: DC | PRN
  Administered 2016-10-31: 1

## 2016-10-31 MED ORDER — SCOPOLAMINE 1 MG/3DAYS TD PT72: 1.0000 | MEDICATED_PATCH | Freq: Once | TRANSDERMAL | Status: DC

## 2016-10-31 MED ORDER — OXYCODONE-ACETAMINOPHEN 5-325 MG PO TABS
1.0000 | ORAL_TABLET | ORAL | Status: DC | PRN
  Administered 2016-11-02 – 2016-11-03 (×5): 1 via ORAL
  Filled 2016-10-31 (×6): qty 1

## 2016-10-31 MED ORDER — MEPERIDINE HCL 25 MG/ML IJ SOLN: 6.2500 mg | INTRAMUSCULAR | Status: DC | PRN

## 2016-10-31 MED ORDER — MORPHINE SULFATE (PF) 0.5 MG/ML IJ SOLN
INTRAMUSCULAR | Status: DC | PRN
  Administered 2016-10-31: .2 mg via INTRATHECAL

## 2016-10-31 MED ORDER — SCOPOLAMINE 1 MG/3DAYS TD PT72
MEDICATED_PATCH | TRANSDERMAL | Status: DC | PRN
  Administered 2016-10-31: 1 via TRANSDERMAL

## 2016-10-31 SURGICAL SUPPLY — 22 items
CHLORAPREP W/TINT 26ML (MISCELLANEOUS) ×1 IMPLANT
CLOTH BEACON ORANGE TIMEOUT ST (SAFETY) ×1 IMPLANT
DRSG OPSITE POSTOP 4X10 (GAUZE/BANDAGES/DRESSINGS) ×1 IMPLANT
ELECT REM PT RETURN 9FT ADLT (ELECTROSURGICAL) ×2
ELECTRODE REM PT RTRN 9FT ADLT (ELECTROSURGICAL) IMPLANT
GLOVE BIOGEL PI IND STRL 6.5 (GLOVE) IMPLANT
GLOVE BIOGEL PI IND STRL 7.0 (GLOVE) IMPLANT
GLOVE BIOGEL PI INDICATOR 6.5 (GLOVE) ×1
GLOVE BIOGEL PI INDICATOR 7.0 (GLOVE) ×1
GLOVE ECLIPSE 6.5 STRL STRAW (GLOVE) ×1 IMPLANT
GOWN STRL REUS W/TWL LRG LVL3 (GOWN DISPOSABLE) ×3 IMPLANT
NS IRRIG 1000ML POUR BTL (IV SOLUTION) ×1 IMPLANT
PACK C SECTION WH (CUSTOM PROCEDURE TRAY) ×1 IMPLANT
PAD OB MATERNITY 4.3X12.25 (PERSONAL CARE ITEMS) ×1 IMPLANT
PENCIL SMOKE EVAC W/HOLSTER (ELECTROSURGICAL) ×1 IMPLANT
RTRCTR C-SECT PINK 25CM LRG (MISCELLANEOUS) ×1 IMPLANT
STAPLER VISISTAT 35W (STAPLE) ×1 IMPLANT
SUT MON AB 2-0 CT1 27 (SUTURE) ×1 IMPLANT
SUT VIC AB 0 CTX 36 (SUTURE) ×8
SUT VIC AB 0 CTX36XBRD ANBCTRL (SUTURE) IMPLANT
TOWEL OR 17X24 6PK STRL BLUE (TOWEL DISPOSABLE) ×1 IMPLANT
TRAY FOLEY CATH SILVER 14FR (SET/KITS/TRAYS/PACK) ×1 IMPLANT

## 2016-10-31 NOTE — Brief Op Note (Signed)
10/31/2016  6:39 PM  PATIENT:  Barbara CowerAerica K Garceau  26 y.o. female  PRE-OPERATIVE DIAGNOSIS:  DI DI TWINS, IUGR BABY B/ POLYHAYDRAMNIOS EDD: 11/20/16 NKDA  POST-OPERATIVE DIAGNOSIS:  same  PROCEDURE:  Procedure(s) with comments: CESAREAN SECTION MULTI-GESTATIONAL (N/A) -  Dr Mora ApplPinn to assist  SURGEON:  Surgeon(s) and Role:    * Philip AspenSidney Deshaun Weisinger, DO - Primary    * Essie HartWalda Pinn, MD - Assisting  ANESTHESIA:   spinal  EBL:  Total I/O In: 1600 [I.V.:1600] Out: 1000 [Urine:200; Blood:800]  SPECIMEN:  Source of Specimen:  placenta  DISPOSITION OF SPECIMEN:  PATHOLOGY  COUNTS:  YES  PLAN OF CARE: Admit to inpatient   PATIENT DISPOSITION:  PACU - hemodynamically stable.   Delay start of Pharmacological VTE agent (>24hrs) due to surgical blood loss or risk of bleeding: not applicable  FINDINGS: normal uterus, tubes and ovaries bilaterally.  Baby A, female breech, Baby B female breech, weights and APGARs pending.

## 2016-10-31 NOTE — Lactation Note (Signed)
This note was copied from a baby's chart. Lactation Consultation Note  Patient Name: Larey SeatGirlB Sherman Cervenka ZOXWR'UToday's Date: 10/31/2016 Reason for consult: Initial assessment Babies at 1 hr of life. Called to PACU to help latch. Baby Girl latched easily, Baby Boy is doing some mild tongue thrusting but was able to latch after several attempts. Mom bf her older child for 6667m with no issues. She desires to ebf both babies. She was sleepy at this visit, minimal education was done. She requested DEBP for hospital use and a Harmony to take home. Given lactation handouts. Aware of OP services and support group.    Maternal Data Has patient been taught Hand Expression?: Yes Does the patient have breastfeeding experience prior to this delivery?: Yes  Feeding Feeding Type: Breast Fed  LATCH Score/Interventions Latch: Grasps breast easily, tongue down, lips flanged, rhythmical sucking.  Audible Swallowing: A few with stimulation  Type of Nipple: Everted at rest and after stimulation  Comfort (Breast/Nipple): Soft / non-tender     Hold (Positioning): Full assist, staff holds infant at breast Intervention(s): Position options;Support Pillows  LATCH Score: 7  Lactation Tools Discussed/Used     Consult Status Consult Status: Follow-up Date: 11/01/16 Follow-up type: In-patient    Rulon Eisenmengerlizabeth E Kennidy Lamke 10/31/2016, 7:15 PM

## 2016-10-31 NOTE — Transfer of Care (Signed)
Immediate Anesthesia Transfer of Care Note  Patient: Shelia Martin  Procedure(s) Performed: Procedure(s) with comments: CESAREAN SECTION MULTI-GESTATIONAL (N/A) -  Dr Mora ApplPinn to assist  Patient Location: PACU  Anesthesia Type:Spinal  Level of Consciousness: awake  Airway & Oxygen Therapy: Patient Spontanous Breathing  Post-op Assessment: Report given to RN and Post -op Vital signs reviewed and stable  Post vital signs: stable  Last Vitals:  Vitals:   10/31/16 1853 10/31/16 1855  BP: 90/76 115/84  Pulse:  70  Resp:  12  Temp:      Last Pain:  Vitals:   10/31/16 1855  TempSrc:   PainSc: 0-No pain         Complications: No apparent anesthesia complications

## 2016-10-31 NOTE — Anesthesia Procedure Notes (Signed)
Spinal  Patient location during procedure: OB Staffing Anesthesiologist: Ronelle NighEWELL, CHARLES Performed: anesthesiologist  Preanesthetic Checklist Completed: patient identified, surgical consent, pre-op evaluation, timeout performed, IV checked, risks and benefits discussed and monitors and equipment checked Spinal Block Patient position: sitting Prep: site prepped and draped and DuraPrep Patient monitoring: heart rate, cardiac monitor, continuous pulse ox and blood pressure Approach: midline Location: L3-4 Injection technique: single-shot Needle Needle type: Pencan  Needle gauge: 24 G Needle length: 10 cm Assessment Sensory level: T4

## 2016-10-31 NOTE — Plan of Care (Signed)
Problem: Nutritional: Goal: Dietary intake will improve Outcome: Progressing Pt still nauseous, family pushing pt to eat when not ready  Problem: Urinary Elimination: Goal: Ability to reestablish a normal urinary elimination pattern will improve Outcome: Progressing Pt still has catheter in

## 2016-10-31 NOTE — Anesthesia Procedure Notes (Signed)
Spinal  Patient location during procedure: OB Start time: 10/31/2016 5:34 PM End time: 10/31/2016 5:39 PM Staffing Anesthesiologist: Anitra LauthMILLER, Autumn Gunn RAY Performed: anesthesiologist  Preanesthetic Checklist Completed: patient identified, surgical consent, pre-op evaluation, timeout performed, IV checked, risks and benefits discussed and monitors and equipment checked Spinal Block Patient position: sitting Prep: Betadine and site prepped and draped Patient monitoring: heart rate, cardiac monitor, continuous pulse ox and blood pressure Approach: midline Location: L3-4 Injection technique: single-shot Needle Needle type: Pencan  Needle gauge: 25 G Needle length: 10 cm Assessment Sensory level: T4

## 2016-10-31 NOTE — Anesthesia Preprocedure Evaluation (Signed)
Anesthesia Evaluation  Patient identified by MRN, date of birth, ID band Patient awake    Reviewed: Allergy & Precautions, H&P , NPO status , Patient's Chart, lab work & pertinent test results  History of Anesthesia Complications Negative for: history of anesthetic complications  Airway Mallampati: II  TM Distance: >3 FB Neck ROM: full    Dental no notable dental hx. (+) Teeth Intact   Pulmonary neg pulmonary ROS,    Pulmonary exam normal breath sounds clear to auscultation       Cardiovascular negative cardio ROS   Rhythm:regular Rate:Normal     Neuro/Psych negative neurological ROS  negative psych ROS   GI/Hepatic negative GI ROS, Neg liver ROS,   Endo/Other  negative endocrine ROS  Renal/GU negative Renal ROS  negative genitourinary   Musculoskeletal   Abdominal Normal abdominal exam  (+)   Peds  Hematology negative hematology ROS (+)   Anesthesia Other Findings   Reproductive/Obstetrics (+) Pregnancy                             Anesthesia Physical  Anesthesia Plan  ASA: II  Anesthesia Plan: Spinal   Post-op Pain Management:    Induction:   Airway Management Planned:   Additional Equipment:   Intra-op Plan:   Post-operative Plan:   Informed Consent: I have reviewed the patients History and Physical, chart, labs and discussed the procedure including the risks, benefits and alternatives for the proposed anesthesia with the patient or authorized representative who has indicated his/her understanding and acceptance.     Plan Discussed with:   Anesthesia Plan Comments:         Anesthesia Quick Evaluation

## 2016-10-31 NOTE — H&P (Signed)
26 y.o. 4325w1d  G2P1001 comes in c/o for schedule IOL d/t did/di twin pregnancy breech/breech with s<d and polyhydramnios. Seen by MFM who recommended 37 week delivery.   Otherwise has good fetal movement and no bleeding.  Past Medical History:  Diagnosis Date  . Eczema     Past Surgical History:  Procedure Laterality Date  . NO PAST SURGERIES      OB History  Gravida Para Term Preterm AB Living  2 1 1     1   SAB TAB Ectopic Multiple Live Births          1    # Outcome Date GA Lbr Len/2nd Weight Sex Delivery Anes PTL Lv  2 Current           1 Term 04/03/13 5558w5d 02:03 / 00:37 2.449 kg (5 lb 6.4 oz) F Vag-Spont EPI  LIV      Social History   Social History  . Marital status: Single    Spouse name: N/A  . Number of children: N/A  . Years of education: N/A   Occupational History  . Not on file.   Social History Main Topics  . Smoking status: Never Smoker  . Smokeless tobacco: Never Used  . Alcohol use No  . Drug use: No  . Sexual activity: Yes    Partners: Male   Other Topics Concern  . Not on file   Social History Narrative  . No narrative on file   Patient has no known allergies.    Prenatal Transfer Tool  Maternal Diabetes: No Genetic Screening: Normal Maternal Ultrasounds/Referrals: Normal Fetal Ultrasounds or other Referrals:  None Maternal Substance Abuse:  No Significant Maternal Medications:  None Significant Maternal Lab Results: Lab values include: Group B Strep positive  Other PNC: uncomplicated other than what noted above    Vitals:   10/31/16 1614  BP: 137/87  Pulse: 68  Resp: 18  Temp: 98.1 F (36.7 C)     Lungs/Cor:  NAD Abdomen:  soft, gravid Ex:  no cords, erythema  A/P  Admit for scheduled primary c/s for breech/breech di/di twins with growth restriction and polyhydramnios  GBS Pos  Ancef 2 g for surgical prophylaxis  Other routine pre-op care  [place], Luther ParodySIDNEY

## 2016-11-01 LAB — CBC
HEMATOCRIT: 27.4 % — AB (ref 36.0–46.0)
HEMOGLOBIN: 9.3 g/dL — AB (ref 12.0–15.0)
MCH: 25.6 pg — AB (ref 26.0–34.0)
MCHC: 33.9 g/dL (ref 30.0–36.0)
MCV: 75.5 fL — AB (ref 78.0–100.0)
Platelets: 124 10*3/uL — ABNORMAL LOW (ref 150–400)
RBC: 3.63 MIL/uL — AB (ref 3.87–5.11)
RDW: 13.9 % (ref 11.5–15.5)
WBC: 9.3 10*3/uL (ref 4.0–10.5)

## 2016-11-01 LAB — RPR: RPR Ser Ql: NONREACTIVE

## 2016-11-01 NOTE — Anesthesia Postprocedure Evaluation (Signed)
Anesthesia Post Note  Patient: Carrissa K Paquette  Procedure(s) Performed: Procedure(s) (LRB): CESAREAN SECTION MULTI-GESTATIONAL (N/A)  Patient location during evaluation: Mother Baby Anesthesia Type: Spinal Level of consciousness: oriented and awake and alert Pain management: pain level controlled Vital Signs Assessment: post-procedure vital signs reviewed and stable Respiratory status: spontaneous breathing, respiratory function stable and patient connected to nasal cannula oxygen Cardiovascular status: blood pressure returned to baseline and stable Postop Assessment: no headache, no backache and patient able to bend at knees Anesthetic complications: no        Last Vitals:  Vitals:   11/01/16 0135 11/01/16 0525  BP: 116/70   Pulse: 69   Resp: 18 18  Temp: 36.7 C 36.7 C    Last Pain:  Vitals:   11/01/16 0715  TempSrc:   PainSc: 0-No pain   Pain Goal:                 Rica RecordsICKELTON,Allante Beane

## 2016-11-01 NOTE — Lactation Note (Signed)
This note was copied from a baby's chart. Lactation Consultation Note; Experienced BF mom with late preterm twins. Attempted to latch baby boy. Mom easily able to hand express Colostrum. He would take a few sucks then go off to sleep Mom very sleepy too. Assisted with pumping. Mom will bottle feed EBM to babies. No questions at present. To call for assist prn Report given to Fleet Contrasachel RN to make sure mom feeds babies EBM  Patient Name: Boy Candise Bowenserica Vandyne ZOXWR'UToday's Date: 11/01/2016 Reason for consult: Follow-up assessment;Multiple gestation;Late preterm infant   Maternal Data Does the patient have breastfeeding experience prior to this delivery?: Yes  Feeding Feeding Type: Breast Fed Length of feed: 3 min  LATCH Score/Interventions Latch: Too sleepy or reluctant, no latch achieved, no sucking elicited. (few sucks)  Audible Swallowing: None  Type of Nipple: Everted at rest and after stimulation  Comfort (Breast/Nipple): Soft / non-tender     Hold (Positioning): Assistance needed to correctly position infant at breast and maintain latch. Intervention(s): Breastfeeding basics reviewed  LATCH Score: 5  Lactation Tools Discussed/Used     Consult Status Consult Status: Follow-up Date: 11/02/16 Follow-up type: In-patient    Pamelia HoitWeeks, Teran Knittle D 11/01/2016, 1:17 PM

## 2016-11-01 NOTE — Progress Notes (Signed)
Patient is eating, ambulating minimally, voiding.  Pain control is good. No flatus.  No complaints.  Vitals:   10/31/16 2333 11/01/16 0034 11/01/16 0135 11/01/16 0525  BP: 128/85 121/84 116/70   Pulse: (!) 57 (!) 52 69   Resp: 18 18 18 18   Temp: 97.9 F (36.6 C) 97.8 F (36.6 C) 98 F (36.7 C) 98 F (36.7 C)  TempSrc: Oral Oral Oral Oral  SpO2: 100% 100% 100% 100%  Weight:      Height:        Fundus firm Inc: c/d/I Ext: no CT  Lab Results  Component Value Date   WBC 9.3 11/01/2016   HGB 9.3 (L) 11/01/2016   HCT 27.4 (L) 11/01/2016   MCV 75.5 (L) 11/01/2016   PLT 124 (L) 11/01/2016    --/--/O POS (03/16 1605)  A/P Post op day #1 s/p c/s for twins/breech Doing well  Routine care.      Philip AspenALLAHAN, Kirra Verga

## 2016-11-02 ENCOUNTER — Encounter (HOSPITAL_COMMUNITY): Payer: Self-pay | Admitting: Obstetrics and Gynecology

## 2016-11-02 MED ORDER — SALINE SPRAY 0.65 % NA SOLN
1.0000 | NASAL | Status: DC | PRN
  Administered 2016-11-02: 1 via NASAL
  Filled 2016-11-02: qty 44

## 2016-11-02 NOTE — Progress Notes (Signed)
I attempted to ambulate pt in the hallway. Pt refused to ambulate at this time. Will continue to monitor pt.

## 2016-11-02 NOTE — Lactation Note (Addendum)
This note was copied from a baby's chart. Lactation Consultation Note Follow up visit at 50 hours of age.  Baby Girl B is latched to right breast with strong rhythmic sucking and few swallows audible.  LC encouraged mom to hold baby close and limit feedings at the breast to 15 minutes due to 4#7oz weight.  Lc encouraged mom to supplement baby every 3 hours with breast feedings.  Lc encouraged mom to offer 20-130mls.  LC encouraged mom to set timer as baby did not get supplemented during the day today.  Mom has not been consistent with instructions for feeding plan.  Baby had a small concentrated void, and last stool was >20 hours ago.    Baby Boy A- Fob feeding baby bottle.  Lc encouraged FOB to make sure baby has good seal with lips around bottle, pause the feedings for burping and continue to offer 20-6130mls at each feeding.  RN reports mom is pumping and only getting a small amount.  LC reports to Rn to encouraged mom to feed Q3 tonight with supplements to facilitate discharge.     Patient Name: Shelia Martin UXLKG'MToday's Date: 11/02/2016 Reason for consult: Follow-up assessment;Multiple gestation;Infant < 6lbs;Late preterm infant   Maternal Data    Feeding Feeding Type: Breast Fed Nipple Type: Slow - flow Length of feed:  (LC advised to limit to 15 mintues at the breast)  LATCH Score/Interventions Latch: Grasps breast easily, tongue down, lips flanged, rhythmical sucking.  Audible Swallowing: A few with stimulation  Type of Nipple: Everted at rest and after stimulation  Comfort (Breast/Nipple): Soft / non-tender     Hold (Positioning): Assistance needed to correctly position infant at breast and maintain latch. Intervention(s): Breastfeeding basics reviewed;Support Pillows;Position options;Skin to skin  LATCH Score: 8  Lactation Tools Discussed/Used     Consult Status Consult Status: Follow-up Date: 11/03/16 Follow-up type: In-patient    Jannifer RodneyShoptaw, Breta Demedeiros  Lynn 11/02/2016, 8:24 PM

## 2016-11-02 NOTE — Progress Notes (Signed)
Patient is eating, ambulating, voiding.  Pain control is good.  Appropriate lochia, no complaints.  Vitals:   11/01/16 0135 11/01/16 0525 11/01/16 1600 11/02/16 0500  BP: 116/70  113/75 115/68  Pulse: 69  64 60  Resp: 18 18 18 18   Temp: 98 F (36.7 C) 98 F (36.7 C) 98.8 F (37.1 C) 98.4 F (36.9 C)  TempSrc: Oral Oral Oral Oral  SpO2: 100% 100% 99%   Weight:      Height:        Fundus firm Inc: c/d/i No CT  Lab Results  Component Value Date   WBC 9.3 11/01/2016   HGB 9.3 (L) 11/01/2016   HCT 27.4 (L) 11/01/2016   MCV 75.5 (L) 11/01/2016   PLT 124 (L) 11/01/2016    --/--/O POS (03/16 1605)  A/P Post op day #2 s/p c/s for twins/breech Circ in office  Routine care.  Expect d/c 3/19.    Philip AspenALLAHAN, Limmie Schoenberg

## 2016-11-03 MED ORDER — DOCUSATE SODIUM 100 MG PO CAPS: 100.0000 mg | ORAL_CAPSULE | Freq: Two times a day (BID) | ORAL | 0 refills | Status: DC

## 2016-11-03 MED ORDER — OXYCODONE-ACETAMINOPHEN 5-325 MG PO TABS: 1.0000 | ORAL_TABLET | ORAL | 0 refills | Status: DC | PRN

## 2016-11-03 MED ORDER — IBUPROFEN 600 MG PO TABS: 600.0000 mg | ORAL_TABLET | Freq: Four times a day (QID) | ORAL | 0 refills | Status: DC | PRN

## 2016-11-03 NOTE — Op Note (Signed)
NAME:  Shelia Martin, Shelia Martin                  ACCOUNT NO.:  MEDICAL RECORD NO.:  [id number]  LOCATION:                                 FACILITY:  PHYSICIAN:  Philip Aspen, DO    DATE OF BIRTH:  Apr 05, 1991  DATE OF PROCEDURE:  10/31/2016 DATE OF DISCHARGE:                              OPERATIVE REPORT   PREOPERATIVE DIAGNOSIS:  Di-di twins with intrauterine growth restriction of baby B and polyhydramnios.  POSTOPERATIVE DIAGNOSIS:  Di-di twins with intrauterine growth restriction of baby B and polyhydramnios.  PROCEDURE:  Low-transverse cesarean section.  SURGEON:  Philip Aspen, DO.  ASSISTANT:  Dr. Essie Hart, MD.  ANESTHESIA:  Spinal.  IV FLUIDS:  1600 mL.  URINE OUTPUT:  200 mL.  ESTIMATED BLOOD LOSS:  800 mL.  SPECIMENS:  Placenta.  FINDINGS:  Normal uterus, ovaries, and tubes bilaterally.  Baby A, female, breech presentation.  Baby B, female, breech presentation.  Weights and Apgars were pending at the time of surgery.  Please see pediatric notes.  COMPLICATIONS:  None.  CONDITION:  Stable to PACU.  DESCRIPTION OF PROCEDURE:  The patient was taken to the operating room where spinal anesthesia was administered and found to be adequate.  She was prepped and draped in the normal sterile fashion in dorsal supine position.  A Pfannenstiel skin incision was made with the scalpel and carried down to underlying layer of fascia with Bovie cautery.  The scalpel was used to incise the fascia which was extended laterally with Mayo scissors.  Kocher clamps were placed at the superior aspect of the fascial incision and rectus muscles were dissected off bluntly and sharply.  Kocher clamps were then placed at the inferior aspect of the fascial incision, and rectus muscles were dissected off bluntly and sharply.  Hemostat was used to separate the rectus muscles at the midline.  Hemostats were used to grasp the peritoneum, tented, and entered sharply with Metzenbaum scissors  with good visualization.  The peritoneal incision was extended by manual lateral traction.  The abdomen was surveyed.  No abnormalities felt manually.  An Alexis self retractor was placed, and the vesicouterine peritoneum was then identified, tented, and entered sharp with Metzenbaum scissors.  The incision was extended laterally and the bladder flap was developed digitally.  A scalpel was used to make a low transverse cesarean incision.  The amniotic sac was entered sharply.  The hips of Baby A were identified, delivered.  The legs were flexed at the knee and delivered, followed by gentle rotation of the infant's body with the sweeping motion of both the arms across his chest.  The infant's head was kept flexed and delivered without difficulty.  The cord was clamped and cut, and the infant was handed off to awaiting Neonatology.  The second amniotic sac was identified and entered with Allis clamps.  The infant's feet were identified, delivered, followed by the remainder of the infant's body.  It was also rotated gently side-to-side with the sweeping motion to deliver both arms.  The infant's head was kept flexed and delivered without difficulty.  The cord was clamped and cut.  The infant was also handed off to awaiting  neonatology.  Manual massage of the external uterus was performed and Pitocin was started.  The placenta was delivered with gentle traction on the umbilical cord.  The uterus was then cleared of all clot and debris, and the uterine incision was closed with 0 Vicryl in 2 layers.  The first layer was a running locked suture, followed by second layer of horizontal Lembert imbrication. Excellent hemostasis was noted.  Both ovaries and tubes were identified and appeared normal.  The Alexis self retractor was removed.  The incision reexamined and remained hemostatic.  The peritoneum was reapproximated and closed with Monocryl in a running fashion.  The fascia was then  reapproximated and closed with Vicryl in a running fashion.  The subcutaneous tissue was irrigated, dried, and minimal use of Bovie cautery was needed for hemostasis.  The skin was reapproximated and closed with staples.  The patient tolerated the procedure well. Sponge, lap, and needle counts were correct x2.  The patient was taken to recovery in stable condition.          ______________________________ Philip AspenSidney Rafferty Postlewait, DO     [place]/MEDQ  D:  11/02/2016  T:  11/02/2016  Job:  [id number]

## 2016-11-03 NOTE — Lactation Note (Addendum)
This note was copied from a baby's chart. Lactation Consultation Note  Patient Name: Shelia Martin Shelia Martin Reason for consult: Follow-up assessment  Baby is 4665 hours old and has LC walked in mom had been [facility] to latch [facility],  And baby fed 5 mins at the breast and presently was being fed EBM from a bottle.  Mom fed the baby 50 ml of EBM, and the MBU RN finished with 5 ml due to mom having to feed the Twin B at the breast .  Baby has been consistent with feedings - see doc flow sheets , voids and stools QS. LC reviewed the plan for the Twins - and mom is well aware if the babies don't latch they need to have a feeding from a bottle. After feeding the Baby Shelia settled down.  LC received clarification from the facility NP , if the EBM is available to use 1st , if not formula.  Mom has been able to pump off 60 -90 ml at a pumping session.  Per mom has  DEBP at home.  Mom denies soreness, sore and engorgement prevention and tx reviewed.  LC stressed if the breast are really full to start to express off the fulness , so the baby can latch comfortably.  LC stressed the importance of feeding every 3 hours.  LC offered mom  To [facility] and [facility] declined for today, and will consider calling back for appt.. Phone # given to mom.  LC stressed the importance of at least 2 weight checks in the 1st week and half of life for both babies.  Mother informed of post-discharge support and given phone number to the lactation department, including services for phone call assistance; out-patient appointments; and breastfeeding support group. List of other breastfeeding resources in the community given in the handout. Encouraged mother to call for problems or concerns related to breastfeeding.    Maternal Data    Feeding Feeding Type: Breast Milk (bottle fed by Shelia Martin MBU RN ) Nipple Type: Slow - flow Length of feed: 5 min (on and off)  LATCH  Score/Interventions ( latch was done by [facility] Shelia Martin before Shelia Martin enter ed the room  Latch: Repeated attempts needed to sustain latch, nipple held in mouth throughout feeding, stimulation needed to elicit sucking reflex. Intervention(s): Adjust position;Assist with latch;Breast massage (mother demonstrates )  Audible Swallowing: Spontaneous and intermittent  Type of Nipple: Everted at rest and after stimulation  Comfort (Breast/Nipple): Soft / non-tender (breast are full, lactating)     Hold (Positioning): No assistance needed to correctly position infant at breast. Intervention(s): Breastfeeding basics reviewed  LATCH Score: 9  Lactation Tools Discussed/Used     Consult Status Consult Status: Complete (LC ofefred mom and LC O/P appt. and mom declined today and will call back for appt. ) Date: 11/03/16    Shelia Martin Martin, 11:33 AM

## 2016-11-03 NOTE — Lactation Note (Signed)
This note was copied from a baby's chart. Lactation Consultation Note  Patient Name: Shelia Martin Reason for consult: Follow-up assessment Baby is 8066 hour old  Mom latched the baby independently with depth , multiple swallows noted.  And baby fed 15 mins.  Mom aware to feed and to supplement baby after feeding for easy calories to boost weight.  [facility]   Maternal Data    Feeding Feeding Type: Breast Fed Length of feed:  (baby latched with depth , swallows noted )  LATCH Score/Interventions Latch: Grasps breast easily, tongue down, lips flanged, rhythmical sucking.  Audible Swallowing: Spontaneous and intermittent Intervention(s): Skin to skin;Alternate breast massage  Type of Nipple: Everted at rest and after stimulation  Comfort (Breast/Nipple): Filling, red/small blisters or bruises, mild/mod discomfort     Hold (Positioning): No assistance needed to correctly position infant at breast.  LATCH Score: 9  Lactation Tools Discussed/Used     Consult Status Consult Status: Follow-up Date: 11/03/16    Shelia Martin Martin, 12:09 PM

## 2016-11-03 NOTE — Discharge Summary (Signed)
Obstetric Discharge Summary Reason for Admission: cesarean section Prenatal Procedures: NST and ultrasound Intrapartum Procedures: cesarean: low cervical, transverse Postpartum Procedures: none Complications-Operative and Postpartum: none Hemoglobin  Date Value Ref Range Status  11/01/2016 9.3 (L) 12.0 - 15.0 g/dL Final   HCT  Date Value Ref Range Status  11/01/2016 27.4 (L) 36.0 - 46.0 % Final    Physical Exam:  General: alert, cooperative and appears stated age 4Lochia: appropriate Uterine Fundus: firm Incision: healing well DVT Evaluation: No evidence of DVT seen on physical exam.  Discharge Diagnoses: Term Pregnancy-delivered  Discharge Information: Date: 11/03/2016 Activity: pelvic rest Diet: routine Medications: Ibuprofen, Colace and Percocet Condition: improved Instructions: refer to practice specific booklet Discharge to: home Follow-up Information    CALLAHAN, SIDNEY, DO Follow up in 4 week(s).   Specialty:  Obstetrics and Gynecology Why:  For a post partum evaluation Contact information: [address] [place] [id number] [phone]           Newborn Data:   Alcide EvenerBarnette, Boy Caterra [[id number]][[id number]]  Live born female  Birth Weight: 5 lb 6.2 oz (2445 g) APGAR: 8, 9   Larey SeatBarnette, GirlB Zaydee [[id number]][[id number]]  Live born female  Birth Weight: 4 lb 9.6 oz (2085 g) APGAR: 7, 7  Home with mother.  Valene Villa H. 11/03/2016, 9:12 AM

## 2016-12-16 ENCOUNTER — Encounter: Payer: Self-pay | Admitting: Family Medicine

## 2017-01-09 ENCOUNTER — Ambulatory Visit (INDEPENDENT_AMBULATORY_CARE_PROVIDER_SITE_OTHER): Payer: 59 | Admitting: Family

## 2017-01-09 ENCOUNTER — Encounter: Payer: Self-pay | Admitting: Family

## 2017-01-09 VITALS — BP 127/76 | HR 68 | Ht 64.17 in | Wt 129.8 lb

## 2017-01-09 DIAGNOSIS — Z113 Encounter for screening for infections with a predominantly sexual mode of transmission: Secondary | ICD-10-CM

## 2017-01-09 DIAGNOSIS — Z30017 Encounter for initial prescription of implantable subdermal contraceptive: Secondary | ICD-10-CM | POA: Diagnosis not present

## 2017-01-09 DIAGNOSIS — Z3202 Encounter for pregnancy test, result negative: Secondary | ICD-10-CM

## 2017-01-09 LAB — POCT URINE PREGNANCY: PREG TEST UR: NEGATIVE

## 2017-01-09 MED ORDER — ETONOGESTREL 68 MG ~~LOC~~ IMPL
68.0000 mg | DRUG_IMPLANT | Freq: Once | SUBCUTANEOUS | Status: AC
  Administered 2017-01-19: 68 mg via SUBCUTANEOUS

## 2017-01-09 NOTE — Progress Notes (Addendum)
THIS RECORD MAY CONTAIN CONFIDENTIAL INFORMATION THAT SHOULD NOT BE RELEASED WITHOUT REVIEW OF THE SERVICE PROVIDER.  Adolescent Medicine Consultation Follow-Up Visit Shelia Martin  is a 26 y.o. female referred by [name], Kawanta F, MD here today regarding nexplanon insertion.      - Pertinent Labs? Yes - Growth Chart Viewed? no   History was provided by the patient.  PCP Confirmed?  yes  My Chart Activated?   yes    Chief Complaint  Patient presents with  . New Patient (Initial Visit)    HPI:   31-G2P3 female presents for nexplanon insertion.  -Currently not taking birth control.  -Aware of other methods, desires nexplanon.  -  Patient's last menstrual period was 01/09/2017. No Known Allergies Outpatient Medications Prior to Visit  Medication Sig Dispense Refill  . Prenatal Vit-Fe Fumarate-FA (PRENATAL VITAMIN PO) Take 1 tablet by mouth daily.     Marland Kitchen. docusate sodium (COLACE) 100 MG capsule Take 1 capsule (100 mg total) by mouth 2 (two) times daily. (Patient not taking: Reported on 01/09/2017) 60 capsule 0  . ibuprofen (ADVIL,MOTRIN) 600 MG tablet Take 1 tablet (600 mg total) by mouth every 6 (six) hours as needed. (Patient not taking: Reported on 01/09/2017) 90 tablet 0  . oxyCODONE-acetaminophen (ROXICET) 5-325 MG tablet Take 1-2 tablets by mouth every 4 (four) hours as needed for severe pain. (Patient not taking: Reported on 01/09/2017) 30 tablet 0   No facility-administered medications prior to visit.      Patient Active Problem List   Diagnosis Date Noted  . Multiple gestation with malpresentation of one fetus or more 10/31/2016  . Potential exposure to STD 06/21/2013  . Eczema 06/05/2013  . Contraceptive management 05/04/2013    The following portions of the patient's history were reviewed and updated as appropriate: allergies, current medications, past medical history and problem list.  Physical Exam:  Vitals:   01/09/17 1028  BP: 127/76  Pulse: 68  Weight:  129 lb 12.8 oz (58.9 kg)  Height: 5' 4.17" (1.63 m)   BP 127/76 (BP Location: Right Arm, Patient Position: Sitting, Cuff Size: Normal)   Pulse 68   Ht 5' 4.17" (1.63 m)   Wt 129 lb 12.8 oz (58.9 kg)   LMP 01/09/2017   Breastfeeding? No   BMI 22.16 kg/m  Body mass index: body mass index is 22.16 kg/m. Growth percentile SmartLinks can only be used for patients less than 25 years old.   Physical Exam  Constitutional: She is oriented to person, place, and time. She appears well-developed. No distress.  HENT:  Head: Normocephalic and atraumatic.  Eyes: EOM are normal. Pupils are equal, round, and reactive to light. No scleral icterus.  Neck: Normal range of motion. Neck supple. No thyromegaly present.  Cardiovascular: Normal rate and regular rhythm.   No murmur heard. Pulmonary/Chest: Effort normal and breath sounds normal.  Abdominal: Soft.  Musculoskeletal: Normal range of motion. She exhibits no edema.  Lymphadenopathy:    She has no cervical adenopathy.  Neurological: She is alert and oriented to person, place, and time. No cranial nerve deficit.  Skin: Skin is warm and dry. No rash noted.  Psychiatric: She has a normal mood and affect.  Vitals reviewed.  Assessment/Plan: 1. Encounter for initial prescription of Nexplanon -see procedure note  -reviewed bleeding profile and return precautions  - etonogestrel (NEXPLANON) implant 68 mg; 68 mg by Subdermal route once. - Subdermal Etonogestrel Implant Insertion  NDC [id number]  2. Pregnancy examination or test,  negative result -negative  - POCT urine pregnancy  3. Routine screening for STI (sexually transmitted infection) -per protocol  - GC/Chlamydia Probe Amp   Follow-up:  Return if symptoms worsen or fail to improve.   Medical decision-making:  > 25 minutes spent face to face with patient with more than 50% of appointment spent discussing diagnosis, management, follow-up, and reviewing of Nexplanon

## 2017-01-09 NOTE — Patient Instructions (Signed)
Let us know if any concerns or questions. Call back as needed [phone]  Congratulations on getting your Nexplanon placement!  Below is some important information about Nexplanon.  First remember that Nexplanon does not prevent sexually transmitted infections.  Condoms will help prevent sexually transmitted infections. The Nexplanon starts working 7 days after it was inserted.  There is a risk of getting pregnant if you have unprotected sex in those first 7 days after placement of the Nexplanon.  The Nexplanon lasts for 3 years but can be removed at any time.  You can become pregnant as early as 1 week after removal.  You can have a new Nexplanon put in after the old one is removed if you like.  It is not known whether Nexplanon is as effective in women who are very overweight because the studies did not include many overweight women.  Nexplanon interacts with some medications, including barbiturates, bosentan, carbamazepine, felbamate, griseofulvin, oxcarbazepine, phenytoin, rifampin, St. John's wort, topiramate, HIV medicines.  Please alert your doctor if you are on any of these medicines.  Always tell other healthcare providers that you have a Nexplanon in your arm.  The Nexplanon was placed just under the skin.  Leave the outside bandage on for 24 hours.  Leave the smaller bandage on for 3-5 days or until it falls off on its own.  Keep the area clean and dry for 3-5 days. There is usually bruising or swelling at the insertion site for a few days to a week after placement.  If you see redness or pus draining from the insertion site, call us immediately.  Keep your user card with the date the implant was placed and the date the implant is to be removed.  The most common side effect is a change in your menstrual bleeding pattern.   This bleeding is generally not harmful to you but can be annoying.  Call or come in to see us if you have any concerns about the bleeding or if you have any side  effects or questions.    We will call you in 1 week to check in and we would like you to return to the clinic for a follow-up visit in 1 month.  You can call [facility] 24 hours a day with any questions or concerns.  There is always a nurse or doctor available to take your call.  Call 9-1-1 if you have a life-threatening emergency.  For anything else, please call us at [phone] before heading to the ER.

## 2017-01-13 ENCOUNTER — Encounter: Payer: Self-pay | Admitting: Family Medicine

## 2017-01-14 LAB — GC/CHLAMYDIA PROBE AMP
CT PROBE, AMP APTIMA: NOT DETECTED
GC PROBE AMP APTIMA: NOT DETECTED

## 2017-01-15 NOTE — Progress Notes (Signed)
Left voicemail to call back regarding lab results.

## 2017-01-19 DIAGNOSIS — Z30017 Encounter for initial prescription of implantable subdermal contraceptive: Secondary | ICD-10-CM | POA: Diagnosis not present

## 2017-02-26 NOTE — Addendum Note (Signed)
Addendum  created 02/26/17 1642 by Holden Maniscalco Ray, MD   Sign clinical note    

## 2017-02-26 NOTE — Anesthesia Postprocedure Evaluation (Signed)
Anesthesia Post Note  Patient: Shelia Martin  Procedure(s) Performed: Procedure(s) (LRB): CESAREAN SECTION MULTI-GESTATIONAL (N/A)     Anesthesia Post Evaluation  Last Vitals:  Vitals:   11/02/16 1900 11/03/16 0617  BP: 128/74 127/76  Pulse: 63 67  Resp: 18 18  Temp: 36.8 C 36.5 C    Last Pain:  Vitals:   11/03/16 1524  TempSrc:   PainSc: 5                  Lowella CurbWarren Ray Lawson Mahone

## 2017-04-17 ENCOUNTER — Ambulatory Visit: Payer: Medicaid Other | Admitting: Family

## 2017-05-15 ENCOUNTER — Ambulatory Visit: Payer: Medicaid Other | Admitting: Family

## 2017-05-22 ENCOUNTER — Other Ambulatory Visit: Payer: Self-pay | Admitting: Family

## 2017-05-22 ENCOUNTER — Ambulatory Visit (INDEPENDENT_AMBULATORY_CARE_PROVIDER_SITE_OTHER): Payer: Medicaid Other

## 2017-05-22 DIAGNOSIS — N926 Irregular menstruation, unspecified: Secondary | ICD-10-CM

## 2017-05-22 DIAGNOSIS — Z130 Encounter for screening for diseases of the blood and blood-forming organs and certain disorders involving the immune mechanism: Secondary | ICD-10-CM

## 2017-05-22 DIAGNOSIS — Z113 Encounter for screening for infections with a predominantly sexual mode of transmission: Secondary | ICD-10-CM | POA: Diagnosis not present

## 2017-05-22 LAB — POCT HEMOGLOBIN: Hemoglobin: 11.7 g/dL — AB (ref 12.2–16.2)

## 2017-05-22 NOTE — Progress Notes (Signed)
Pt here today with bleeding concerns with Nexplanon. Pt states since Nexplanon, bleeding has not subsided. She is interested in getting Nexplanon removed. Due to no appt avail, could not do today. However, also tested hgb which was normal at 11.7. Will test urine for GC/Chl for possible reason for prolonged bleeding. Discharged patient with soonest avail follow up appointment. Gave supportive care advice and pt voiced understanding.

## 2017-05-23 LAB — C. TRACHOMATIS/N. GONORRHOEAE RNA
C. trachomatis RNA, TMA: NOT DETECTED
N. gonorrhoeae RNA, TMA: NOT DETECTED

## 2017-06-01 ENCOUNTER — Encounter: Payer: Self-pay | Admitting: Pediatrics

## 2017-06-01 ENCOUNTER — Ambulatory Visit (INDEPENDENT_AMBULATORY_CARE_PROVIDER_SITE_OTHER): Payer: Medicaid Other | Admitting: Pediatrics

## 2017-06-01 VITALS — BP 112/63 | HR 51 | Ht 64.17 in | Wt 119.8 lb

## 2017-06-01 DIAGNOSIS — Z30011 Encounter for initial prescription of contraceptive pills: Secondary | ICD-10-CM | POA: Diagnosis not present

## 2017-06-01 DIAGNOSIS — Z978 Presence of other specified devices: Secondary | ICD-10-CM | POA: Diagnosis not present

## 2017-06-01 DIAGNOSIS — N921 Excessive and frequent menstruation with irregular cycle: Secondary | ICD-10-CM | POA: Insufficient documentation

## 2017-06-01 DIAGNOSIS — Z975 Presence of (intrauterine) contraceptive device: Principal | ICD-10-CM

## 2017-06-01 MED ORDER — NORETHIN ACE-ETH ESTRAD-FE 1.5-30 MG-MCG PO TABS: 1.0000 | ORAL_TABLET | Freq: Every day | ORAL | 4 refills | Status: DC

## 2017-06-01 NOTE — Patient Instructions (Addendum)
Start taking birth control pill today. I have sent in enough pills to get you through a year!   [facility]  [phone] Call for a new patient appointment!   Your Nexplanon was removed today and is no longer preventing pregnancy.  If you have sex, remember to use condoms to prevent pregnancy and to prevent sexually transmitted infections.  Leave the outside bandage on for 24 hours.  Leave the smaller bandages on for 3-5 days or until they fall off on their own.  Keep the area clean and dry for 3-5 days.  There is usually bruising or swelling at and around the removal site for a few days to a week after the removal.  If you see redness or pus draining from the removal site, call us immediately.  We would like you to return to the clinic for a follow-up visit in 1 month.  You can call [facility] 24 hours a day with any questions or concerns.  There is always a nurse or doctor available to take your call.  Call 9-1-1 if you have a life-threatening emergency.  For anything else, please call us at [phone] before heading to the ER.

## 2017-06-01 NOTE — Progress Notes (Signed)

## 2017-06-01 NOTE — Progress Notes (Signed)
THIS RECORD MAY CONTAIN CONFIDENTIAL INFORMATION THAT SHOULD NOT BE RELEASED WITHOUT REVIEW OF THE SERVICE PROVIDER.  Adolescent Medicine Consultation Follow-Up Visit Shelia Martin  is a 26 y.o. female referred by Salley Scarlet, MD here today for follow-up regarding birth control.    Last seen in [facility] on 01/09/17 for placement of Nexplanon.  Plan at last visit included Nexplanon placement.  Pertinent Labs? No Growth Chart Viewed? not applicable   History was provided by the patient.  Interpreter? no  PCP Confirmed?  Currently none  My Chart Activated?   yes   No chief complaint on file.   HPI:   Patient presents with desire to remove Nexplanon.  Initially placed in March 2018.  States that she is still having irregular bleeding.  Says it was initially heavier, and now somewhat lighter, but still very bothersome.  Notes bleeding at the start of each month for about 1-2 weeks, with a week or so off and then more bleeding.  She says that the irregularity is very troublesome to her and that she would rather have a regular monthly period.  When all contraception options were reviewed (including taking OCPs on top of Nexplanon), patient stated that she would still prefer to have it removed, and just take OCPs.   No LMP recorded. No Known Allergies Outpatient Medications Prior to Visit  Medication Sig Dispense Refill  . Prenatal Vit-Fe Fumarate-FA (PRENATAL VITAMIN PO) Take 1 tablet by mouth daily.     Marland Kitchen docusate sodium (COLACE) 100 MG capsule Take 1 capsule (100 mg total) by mouth 2 (two) times daily. (Patient not taking: Reported on 01/09/2017) 60 capsule 0  . ibuprofen (ADVIL,MOTRIN) 600 MG tablet Take 1 tablet (600 mg total) by mouth every 6 (six) hours as needed. (Patient not taking: Reported on 01/09/2017) 90 tablet 0  . oxyCODONE-acetaminophen (ROXICET) 5-325 MG tablet Take 1-2 tablets by mouth every 4 (four) hours as needed for severe pain. (Patient not  taking: Reported on 01/09/2017) 30 tablet 0   No facility-administered medications prior to visit.      Patient Active Problem List   Diagnosis Date Noted  . Breakthrough bleeding on Nexplanon 06/01/2017  . Eczema 06/05/2013  . Contraceptive management 05/04/2013    Gender identity: female Sex assigned at birth: female Pronouns: she Partner preference?  female  Sexually Active?  yes  Pregnancy Prevention:  implant Reviewed condoms:  yes Reviewed EC:  yes    The following portions of the patient's history were reviewed and updated as appropriate: allergies, current medications, past family history, past medical history, past social history, past surgical history and problem list.  Physical Exam:  Vitals:   06/01/17 1029  BP: 112/63  Pulse: (!) 51  Weight: 119 lb 12.8 oz (54.3 kg)  Height: 5' 4.17" (1.63 m)   BP 112/63 (BP Location: Right Arm, Patient Position: Sitting, Cuff Size: Normal)   Pulse (!) 51   Ht 5' 4.17" (1.63 m)   Wt 119 lb 12.8 oz (54.3 kg)   BMI 20.45 kg/m  Body mass index: body mass index is 20.45 kg/m. Growth percentile SmartLinks can only be used for patients less than 7 years old.  Physical Exam  Constitutional: She is oriented to person, place, and time. She appears well-developed. No distress.  HENT:  Head: Normocephalic.  Cardiovascular: Normal rate and regular rhythm.   No murmur heard. Pulmonary/Chest: Effort normal and breath sounds normal.  Musculoskeletal: Normal range of motion.  Neurological: She is  alert and oriented to person, place, and time.    Assessment/Plan: 1. Encounter for birth control: - Nexplanon was removed today - Prescription for OCPs sent to pharmacy Discussed importance of contraception and discussed options for contraception.  2. Primary Care:  Patient interested in establishing with a primary care provider - Providing patient with information for Family Medicine clinic   Follow-up:  No Follow-up on file.    Medical decision-making:  >25 minutes spent face to face with patient with more than 50% of appointment spent discussing diagnosis, management, follow-up, and reviewing of birth control options and contraception.   Patient was seen by Juanda Chance, MD PGY3 under the supervision of Alfonso Ramus, NP.

## 2017-12-16 ENCOUNTER — Ambulatory Visit: Payer: Medicaid Other | Admitting: Pediatrics

## 2017-12-17 ENCOUNTER — Ambulatory Visit (INDEPENDENT_AMBULATORY_CARE_PROVIDER_SITE_OTHER): Payer: Medicaid Other | Admitting: Pediatrics

## 2017-12-17 ENCOUNTER — Telehealth (HOSPITAL_COMMUNITY): Payer: Self-pay | Admitting: Family Medicine

## 2017-12-17 ENCOUNTER — Encounter: Payer: Self-pay | Admitting: Pediatrics

## 2017-12-17 VITALS — Ht 64.0 in | Wt 122.8 lb

## 2017-12-17 DIAGNOSIS — Z3049 Encounter for surveillance of other contraceptives: Secondary | ICD-10-CM | POA: Diagnosis not present

## 2017-12-17 DIAGNOSIS — Z3202 Encounter for pregnancy test, result negative: Secondary | ICD-10-CM

## 2017-12-17 DIAGNOSIS — Z113 Encounter for screening for infections with a predominantly sexual mode of transmission: Secondary | ICD-10-CM

## 2017-12-17 DIAGNOSIS — Z3042 Encounter for surveillance of injectable contraceptive: Secondary | ICD-10-CM | POA: Diagnosis not present

## 2017-12-17 LAB — POCT RAPID HIV: Rapid HIV, POC: NEGATIVE

## 2017-12-17 LAB — POCT URINE PREGNANCY: PREG TEST UR: NEGATIVE

## 2017-12-17 MED ORDER — MEDROXYPROGESTERONE ACETATE 150 MG/ML IM SUSP
150.0000 mg | Freq: Once | INTRAMUSCULAR | Status: AC
  Administered 2017-12-17: 150 mg via INTRAMUSCULAR

## 2017-12-17 NOTE — Telephone Encounter (Signed)
Opened in error

## 2017-12-17 NOTE — Progress Notes (Signed)
THIS RECORD MAY CONTAIN CONFIDENTIAL INFORMATION THAT SHOULD NOT BE RELEASED WITHOUT REVIEW OF THE SERVICE PROVIDER.  Adolescent Medicine Consultation Follow-Up Visit Shelia Martin  is a 27 y.o. female referred by Salley Scarlet, MD here today for follow-up regarding starting Depo-Provera contraception and being screened for STI.    Last seen in Adolescent Medicine Clinic on 06/01/17 for breakthrough bleeding with Nexplanon.  Plan at last visit included removal of Nexplanon and starting birth control pills.  Pertinent Labs? Yes Growth Chart Viewed? yes   History was provided by the patient.  Interpreter? no  PCP Confirmed?  yes  Chief Complaint  Patient presents with  . Follow-up    HPI:  Shelia Martin  Is a 27 year old female presenting today for follow up of birth control method and STI screening. She provides the history:  - Would like to get started on the Depo shot since she has previously tolerated this before - She is not interested in other methods such as IUD because she does not like the idea of something being inside her body - After having her Nexplanon removed earlier in the year she was on the birth control pill but stopped taking it several months ago - At this time she is not sexually active  - Last sexual partner was her children's father > 1 month ago - Has been sexually active with this 1 female partner and has had inconsistent condom use - LMP was 12/13/17 - Denies fever, chills, night sweat, weight loss, no urinary symptoms, no vaginal discharge, no vaginal lesions - Overall, she reports she is doing very well and is trying to get "a new start on life" starting with taking care of her body   Patient's last menstrual period was 12/13/2017. No Known Allergies Outpatient Medications Prior to Visit  Medication Sig Dispense Refill  . Prenatal Vit-Fe Fumarate-FA (PRENATAL VITAMIN PO) Take 1 tablet by mouth daily.     . norethindrone-ethinyl estradiol-iron (JUNEL  FE 1.5/30) 1.5-30 MG-MCG tablet Take 1 tablet by mouth daily. (Patient not taking: Reported on 12/17/2017) 3 Package 4   No facility-administered medications prior to visit.      Patient Active Problem List   Diagnosis Date Noted  . Eczema 06/05/2013  . Contraceptive management 05/04/2013    Social History: - Currently working for [employer]  - Has 3 kids at home - Lives alone with kids  Activities:  Special interests/hobbies/sports: Wants to travel  Lifestyle habits that can impact QOL: Sleep: at least 8 hours a night Eating habits/patterns: Eating 3 meals daily "as much as I can"  Water intake: 2-3 water bottles daily Exercise: walks daily  Physical Exam:  Vitals:   12/17/17 1118  Weight: 122 lb 12.8 oz (55.7 kg)  Height:  (1.626 m)   Ht  (1.626 m)   Wt 122 lb 12.8 oz (55.7 kg)   LMP 12/13/2017   BMI 21.08 kg/m  Body mass index: body mass index is 21.08 kg/m. Growth percentile SmartLinks can only be used for patients less than 59 years old.  Physical Exam  Constitutional: She is oriented to person, place, and time. She appears well-developed and well-nourished. No distress.  HENT:  Head: Normocephalic.  Mouth/Throat: Oropharynx is clear and moist. No oropharyngeal exudate.  Eyes: Pupils are equal, round, and reactive to light. Conjunctivae and EOM are normal.  Neck: Normal range of motion.  Cardiovascular: Normal rate, regular rhythm, normal heart sounds and intact distal pulses.  Pulmonary/Chest: Effort  normal and breath sounds normal.  Abdominal: Soft. Bowel sounds are normal.  Neurological: She is alert and oriented to person, place, and time.  Skin: Skin is warm. Capillary refill takes 2 to 3 seconds.  Psychiatric: She has a normal mood and affect.    Assessment/Plan: Belita is a 27 year old female G2P2003 presenting for birth control and screening of STI. On review of history and physical exam there are no concerning symptoms for sexually  transmitted infections and no apparent contraindications to starting the Depo-Provera injections. Urine pregnancy in clinic was negative. Reviewed side effects and return precautions.   1. Encounter for Birth Control - Administered Depo- Provera 150 mg injection - Return in 3 months for next dosing  2. Screen for STI - Obtained: G/C, HIV, and wet prep - Counseled on safe sex practices and reviewed signs/symptoms of sexually transmitted infections  Follow-up:  3 months  Medical decision-making:  > 25 minutes spent face to face with patient with more than 50% of appointment spent discussing diagnosis, management, follow-up, and reviewing of contraception and STI screening.  Melida Quitter MD PGY-2 Pediatrics

## 2017-12-18 LAB — WET PREP BY MOLECULAR PROBE
Candida species: NOT DETECTED
MICRO NUMBER:: 90537385
SPECIMEN QUALITY: ADEQUATE
TRICHOMONAS VAG: NOT DETECTED

## 2017-12-18 LAB — C. TRACHOMATIS/N. GONORRHOEAE RNA
C. TRACHOMATIS RNA, TMA: NOT DETECTED
N. gonorrhoeae RNA, TMA: NOT DETECTED

## 2017-12-20 ENCOUNTER — Encounter: Payer: Self-pay | Admitting: Pediatrics

## 2018-03-04 ENCOUNTER — Ambulatory Visit: Payer: Self-pay

## 2018-05-18 ENCOUNTER — Ambulatory Visit (INDEPENDENT_AMBULATORY_CARE_PROVIDER_SITE_OTHER): Payer: BLUE CROSS/BLUE SHIELD | Admitting: Family Medicine

## 2018-05-18 ENCOUNTER — Encounter: Payer: Self-pay | Admitting: Family Medicine

## 2018-05-18 ENCOUNTER — Other Ambulatory Visit: Payer: Self-pay

## 2018-05-18 VITALS — BP 110/62 | HR 68 | Temp 98.7°F | Resp 16 | Ht 64.0 in | Wt 128.0 lb

## 2018-05-18 DIAGNOSIS — L509 Urticaria, unspecified: Secondary | ICD-10-CM

## 2018-05-18 DIAGNOSIS — Z021 Encounter for pre-employment examination: Secondary | ICD-10-CM

## 2018-05-18 DIAGNOSIS — Z0000 Encounter for general adult medical examination without abnormal findings: Secondary | ICD-10-CM

## 2018-05-18 MED ORDER — MONTELUKAST SODIUM 10 MG PO TABS: 10.0000 mg | ORAL_TABLET | Freq: Every day | ORAL | 3 refills | Status: DC

## 2018-05-18 MED ORDER — HYDROXYZINE HCL 10 MG PO TABS: 10.0000 mg | ORAL_TABLET | Freq: Three times a day (TID) | ORAL | 0 refills | Status: DC | PRN

## 2018-05-18 NOTE — Progress Notes (Signed)
Patient ID: Shelia Martin, female    DOB: 11-05-1990, 27 y.o.   MRN: [id number]  PCP: Salley Scarlet, MD  Chief Complaint  Patient presents with  . Forms    [employer] requires letter stating patient is cleared for physical exertion  . Rash    hive like areas when overheated  . Annual Exam    Subjective:   Shelia Martin is a 27 y.o. female, here for CPE, also has CC of hives and need for a preemployment physical and forms completed so she can do a [employer] physical test. She is a patient of Dr. Jeanice Lim, has not had any physical or other acute care since 2017 when she was managed for early pregnancy at that time.  Patient only has past medical history of eczema, no other diagnoses such as high blood pressure, diabetes or high cholesterol.  She denies any pertinent family history other than Huntington's.   She has no health complaints or concerns since she was seen by Dr. Jeanice Lim over 2 years ago.  She suspects her last lab work was done during pregnancy, last Pap smear was also done during pregnancy.  Does complain of new rash that she describes as small raised patches of rash that occur when she gets very hot.  They will rapidly worsened when extremely hot or when working out, they grow in size and a big large red raised patches and are very itchy.  Been occurring intermittently over the past several months during the hot summer weather.  Typically will take a Zyrtec and they resolve.  She has never had any associated symptoms occur with the rash, she denies swelling of lips or face, stridor, shortness of breath, wheeze, nausea, vomiting.  She is not noticed anything else that brings on the hives such as any change in detergents, soaps, foods or medications.  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  She states that she has not been training or  working out for this but she hopes to come in the test is in roughly 3 weeks.  She denies any joint or back pain or injuries or limitations.  She denies any personal history of exertional chest pain, near-syncope or shortness of breath, she denies any history of sudden cardiac death.  No history of asthma, heart disease.    Tests consist of running, jumping, push-ups, crawling over and under obstacles, dragging 175 lbs dummy.  Other minimal requirements specify gross hearing, vision and color vision.  Patient otherwise regarding her complete physical today denies any difficulty with sleeping, she feels well in general, is eating fairly healthy, and exercising occasionally.  Her Tdap is updated, Pap is updated and is only due for flu today    Patient Active Problem List   Diagnosis Date Noted  . Eczema 06/05/2013  . Contraceptive management 05/04/2013     Prior to Admission medications   Not on File     No Known Allergies   Family History  Problem Relation Age of Onset  . Diabetes Mother   . Diabetes Father      Social History   Socioeconomic History  . Marital status: Single    Spouse name: Not on file  . Number of children: Not on file  . Years of education: Not on file  . Highest education level: Not on file  Occupational History  .  Not on file  Social Needs  . Financial resource strain: Not on file  . Food insecurity:    Worry: Not on file    Inability: Not on file  . Transportation needs:    Medical: Not on file    Non-medical: Not on file  Tobacco Use  . Smoking status: Never Smoker  . Smokeless tobacco: Never Used  Substance and Sexual Activity  . Alcohol use: No  . Drug use: No  . Sexual activity: Yes    Partners: Male  Lifestyle  . Physical activity:    Days per week: Not on file    Minutes per session: Not on file  . Stress: Not on file  Relationships  . Social connections:    Talks on phone: Not on file    Gets together: Not on file     Attends religious service: Not on file    Active member of club or organization: Not on file    Attends meetings of clubs or organizations: Not on file    Relationship status: Not on file  . Intimate partner violence:    Fear of current or ex partner: Not on file    Emotionally abused: Not on file    Physically abused: Not on file    Forced sexual activity: Not on file  Other Topics Concern  . Not on file  Social History Narrative  . Not on file     Review of Systems  Constitutional: Negative.  Negative for activity change, appetite change, fatigue and unexpected weight change.  HENT: Negative.   Eyes: Negative.   Respiratory: Negative.  Negative for apnea, cough, chest tightness, shortness of breath and wheezing.   Cardiovascular: Negative.  Negative for chest pain, palpitations and leg swelling.  Gastrointestinal: Negative.  Negative for abdominal pain, blood in stool, constipation, diarrhea, nausea and vomiting.  Endocrine: Negative.   Genitourinary: Negative.   Musculoskeletal: Negative.  Negative for arthralgias, back pain, gait problem, joint swelling, myalgias, neck pain and neck stiffness.  Skin: Positive for rash. Negative for color change, pallor and wound.  Allergic/Immunologic: Negative.   Neurological: Negative.  Negative for syncope and weakness.  Hematological: Negative.   Psychiatric/Behavioral: Negative.  Negative for confusion, dysphoric mood, self-injury and suicidal ideas. The patient is not nervous/anxious.   All other systems reviewed and are negative.      Objective:    Vitals:   05/18/18 1226  BP: 110/62  Pulse: 68  Resp: 16  Temp: 98.7 F (37.1 C)  TempSrc: Oral  SpO2: 98%  Weight: 128 lb (58.1 kg)  Height: 5\' 4"  (1.626 m)      Physical Exam  Constitutional: She is oriented to person, place, and time. She appears well-developed and well-nourished.  Non-toxic appearance. No distress.  HENT:  Head: Normocephalic and atraumatic.  Right  Ear: External ear normal.  Left Ear: External ear normal.  Nose: Nose normal.  Mouth/Throat: Uvula is midline, oropharynx is clear and moist and mucous membranes are normal.  Eyes: Pupils are equal, round, and reactive to light. Conjunctivae, EOM and lids are normal.  Neck: Normal range of motion and phonation normal. Neck supple. No tracheal deviation present. No thyromegaly present.  Cardiovascular: Normal rate, regular rhythm, normal heart sounds and normal pulses. Exam reveals no gallop and no friction rub.  No murmur heard. Pulses:      Radial pulses are 2+ on the right side, and 2+ on the left side.       Posterior  tibial pulses are 2+ on the right side, and 2+ on the left side.  Pulmonary/Chest: Effort normal and breath sounds normal. No stridor. No respiratory distress. She has no wheezes. She has no rhonchi. She has no rales. She exhibits no tenderness.  Abdominal: Soft. Normal appearance and bowel sounds are normal. She exhibits no distension. There is no tenderness. There is no rebound and no guarding.  Musculoskeletal: Normal range of motion. She exhibits no edema or deformity.       Right shoulder: Normal.       Left shoulder: Normal.       Right hip: Normal.       Left hip: Normal.       Right knee: Normal.       Left knee: Normal.       Right ankle: Normal.       Left ankle: Normal.       Cervical back: Normal.       Thoracic back: Normal.       Lumbar back: Normal.  Lymphadenopathy:    She has no cervical adenopathy.  Neurological: She is alert and oriented to person, place, and time. No sensory deficit. She exhibits normal muscle tone. Coordination and gait normal.  Skin: Skin is warm, dry and intact. Capillary refill takes less than 2 seconds. No rash noted. She is not diaphoretic. No pallor.  Psychiatric: She has a normal mood and affect. Her speech is normal and behavior is normal. Judgment and thought content normal.  Nursing note and vitals reviewed.           Assessment & Plan:      ICD-10-CM   1. General medical exam Z00.00 CBC with Differential/Platelet    COMPLETE METABOLIC PANEL WITH GFR    Lipid panel  2. Pre-employment examination Z02.1   3. Hives L50.9      CPE:  Healthy 27 y/o AAF, currently healthy, has mild eczema and some new hives related to heat, otherwise w/o chronic disease. UTD HM as listed below, pt refuses to do flu today due to time - has to go get her kids, so she will return for nurse visit.  Health Maintenance  Topic Date Due  . INFLUENZA VACCINE  03/18/2018  . PAP SMEAR  07/02/2019  . TETANUS/TDAP  04/06/2023  . HIV Screening  Completed   Requests [place] [facility] physician statement form completion stating she "can safely perform the physical ability test.   Reviewed PMHx, cardiac, pulm and MSK exam unremarkable, to the best of my knowledge, pt should be safe with vigorous physical activity.  Encouraged her to train is but she can in the next couple weeks and use proper body mechanics.    ICD-10-CM   1. General medical exam Z00.00 CBC with Differential/Platelet    COMPLETE METABOLIC PANEL WITH GFR    Lipid panel  2. Pre-employment examination Z02.1   3. Hives L50.9 hydrOXYzine (ATARAX/VISTARIL) 10 MG tablet    montelukast (SINGULAIR) 10 MG tablet   Form completed for the patient  For hives encourage the patient to take Zyrtec once nightly for prevention of hives, if she continues to have rash occur when in hot weather can start taking Singulair and Zyrtec together each night for prevention.  If that still does not help she was encouraged to use hydroxyzine as needed to treat the rash.  And she was encouraged to contact us if these measures do not help, she can be referred to an allergist but we  did discuss how hives can often be idiopathic and typically just treat them as needed.    Danelle Berry, PA-C 05/18/18 12:38 PM

## 2018-05-18 NOTE — Patient Instructions (Signed)
Try adding montelukast to daily Singulair and see if this decreases your frequency of hives, if this does not you can take hydroxyzine as needed at the onset of hives to help them resolve.  Up as needed.

## 2018-06-11 ENCOUNTER — Telehealth: Payer: Self-pay | Admitting: Family Medicine

## 2018-06-11 NOTE — Telephone Encounter (Signed)
Call placed to patient.   Advised that depo has not been discussed or prescribed from [facility]. Advised OV will be required.   Appointment scheduled.

## 2018-06-11 NOTE — Telephone Encounter (Signed)
Please call depo into [facility] [place]

## 2018-06-12 ENCOUNTER — Emergency Department (HOSPITAL_COMMUNITY)
Admission: EM | Admit: 2018-06-12 | Discharge: 2018-06-12 | Disposition: A | Discharge status: Home / Self Care | Payer: Self-pay | Attending: Emergency Medicine | Admitting: Emergency Medicine

## 2018-06-12 ENCOUNTER — Emergency Department (HOSPITAL_COMMUNITY): Payer: Self-pay

## 2018-06-12 ENCOUNTER — Encounter (HOSPITAL_COMMUNITY): Payer: Self-pay

## 2018-06-12 DIAGNOSIS — M79644 Pain in right finger(s): Secondary | ICD-10-CM | POA: Insufficient documentation

## 2018-06-12 MED ORDER — IBUPROFEN 400 MG PO TABS
400.0000 mg | ORAL_TABLET | Freq: Once | ORAL | Status: AC
  Administered 2018-06-12: 400 mg via ORAL
  Filled 2018-06-12: qty 1

## 2018-06-12 MED ORDER — IBUPROFEN 400 MG PO TABS: 400.0000 mg | ORAL_TABLET | Freq: Four times a day (QID) | ORAL | 0 refills | Status: DC | PRN

## 2018-06-12 NOTE — ED Triage Notes (Signed)
Pt got the middle and 2nd fingers of her right hand caught in a machine at work.  Pt denies real pain, mild soreness only.

## 2018-06-12 NOTE — ED Provider Notes (Signed)
Emergency Department Provider Note   I have reviewed the triage vital signs and the nursing notes.   HISTORY  Chief Complaint Finger Injury   HPI Shelia Martin is a 27 y.o. female with history as documented below the presents to the emergency department for work where which she landed on her third and fourth fingers of her right hand causing pain and swelling to both seems to be gradually improving.  No injuries elsewhere.  Has full range of motion. No other associated or modifying symptoms.    Past Medical History:  Diagnosis Date  . Eczema     Patient Active Problem List   Diagnosis Date Noted  . Eczema 06/05/2013  . Contraceptive management 05/04/2013    Past Surgical History:  Procedure Laterality Date  . CESAREAN SECTION MULTI-GESTATIONAL N/A 10/31/2016   Procedure: CESAREAN SECTION MULTI-GESTATIONAL;  Surgeon: Shelia Aspen, DO;  Location: [facility];  Service: Obstetrics;  Laterality: N/A;   Dr Mora Appl to assist  . NO PAST SURGERIES      Current Outpatient Rx  . Order #: [id number] Class: Normal  . Order #: [id number] Class: Normal  . Order #: [id number] Class: Normal    Allergies Patient has no known allergies.  Family History  Problem Relation Age of Onset  . Diabetes Mother   . Diabetes Father     Social History Social History   Tobacco Use  . Smoking status: Never Smoker  . Smokeless tobacco: Never Used  Substance Use Topics  . Alcohol use: No  . Drug use: No    Review of Systems  All other systems negative except as documented in the HPI. All pertinent positives and negatives as reviewed in the HPI. ____________________________________________   PHYSICAL EXAM:  VITAL SIGNS: ED Triage Vitals  Enc Vitals Group     BP 06/12/18 0203 110/62     Pulse Rate 06/12/18 0203 72     Resp 06/12/18 0203 15     Temp --      Temp Source 06/12/18 0203 Oral     SpO2 06/12/18 0203 100 %     Weight 06/12/18 0157 132 lb (59.9 kg)   Height 06/12/18 0157 5\' 4"  (1.626 m)     Head Circumference --      Peak Flow --      Pain Score 06/12/18 0157 2     Pain Loc --      Pain Edu? --      Excl. in GC? --     Constitutional: Alert and oriented. Well appearing and in no acute distress. Eyes: Conjunctivae are normal. PERRL. EOMI. Head: Atraumatic. Nose: No congestion/rhinnorhea. Mouth/Throat: Mucous membranes are moist.  Oropharynx non-erythematous. Neck: No stridor.  No meningeal signs.   Cardiovascular: Normal rate, regular rhythm. Good peripheral circulation. Grossly normal heart sounds.   Respiratory: Normal respiratory effort.  No retractions. Lungs CTAB. Gastrointestinal: Soft and nontender. No distention.  Musculoskeletal: No lower extremity tenderness nor edema. No gross deformities of extremities. ttp of right 3/4 distal fingers with mild swelling. Full ROM without pain. Normal sensation. Neurologic:  Normal speech and language. No gross focal neurologic deficits are appreciated.  Skin:  Skin is warm, dry and intact. No rash noted.   ____________________________________________   RADIOLOGY  Dg Hand Complete Right  Result Date: 06/12/2018 CLINICAL DATA:  Third and fourth fingers caught in machine at work EXAM: RIGHT HAND - COMPLETE 3+ VIEW COMPARISON:  None. FINDINGS: Frontal, oblique, and lateral views obtained. On the frontal view  only, there is an obliquely oriented lucency in the distal aspect of the fourth middle phalanx, concerning for subtle nondisplaced fracture in this area. No other evidence of potential fracture. No dislocation. Joint spaces appear normal. No erosive change. IMPRESSION: Question nondisplaced fracture of the distal aspect of the fourth middle phalanx seen only on the frontal view. No other evidence of potential fracture. No dislocation. Joint spaces appear normal. Electronically Signed   By: Bretta Bang III M.D.   On: 06/12/2018 02:24     ____________________________________________  INITIAL IMPRESSION / ASSESSMENT AND PLAN / ED COURSE  X-ray as above, will have follow-up with primary doctor in a week to repeat x-ray.  We will splint with AlumaFoam until then.     Pertinent labs & imaging results that were available during my care of the patient were reviewed by me and considered in my medical decision making (see chart for details).  ____________________________________________  FINAL CLINICAL IMPRESSION(S) / ED DIAGNOSES  Final diagnoses:  Pain of finger of right hand     MEDICATIONS GIVEN DURING THIS VISIT:  Medications  ibuprofen (ADVIL,MOTRIN) tablet 400 mg (400 mg Oral Given 06/12/18 0212)     NEW OUTPATIENT MEDICATIONS STARTED DURING THIS VISIT:  New Prescriptions   IBUPROFEN (ADVIL,MOTRIN) 400 MG TABLET    Take 1 tablet (400 mg total) by mouth every 6 (six) hours as needed.    Note:  This note was prepared with assistance of Dragon voice recognition software. Occasional wrong-word or sound-a-like substitutions may have occurred due to the inherent limitations of voice recognition software.   Nora Sabey, [name], MD 06/12/18 [phone]

## 2018-06-15 ENCOUNTER — Ambulatory Visit: Payer: Self-pay | Admitting: Family Medicine

## 2018-06-16 ENCOUNTER — Encounter: Payer: Self-pay | Admitting: Family Medicine

## 2018-06-18 ENCOUNTER — Ambulatory Visit: Payer: Self-pay | Admitting: Family Medicine

## 2018-06-21 NOTE — Progress Notes (Signed)
Pt no show No charge

## 2018-08-04 ENCOUNTER — Ambulatory Visit (INDEPENDENT_AMBULATORY_CARE_PROVIDER_SITE_OTHER): Payer: BLUE CROSS/BLUE SHIELD | Admitting: Family Medicine

## 2018-08-04 ENCOUNTER — Encounter: Payer: Self-pay | Admitting: Family Medicine

## 2018-08-04 ENCOUNTER — Other Ambulatory Visit: Payer: Self-pay

## 2018-08-04 VITALS — BP 118/62 | HR 70 | Temp 98.1°F | Resp 12 | Ht 64.0 in | Wt 136.0 lb

## 2018-08-04 DIAGNOSIS — Z309 Encounter for contraceptive management, unspecified: Secondary | ICD-10-CM

## 2018-08-04 DIAGNOSIS — Z113 Encounter for screening for infections with a predominantly sexual mode of transmission: Secondary | ICD-10-CM

## 2018-08-04 LAB — WET PREP FOR TRICH, YEAST, CLUE

## 2018-08-04 LAB — PREGNANCY, URINE: Preg Test, Ur: NEGATIVE

## 2018-08-04 MED ORDER — MEDROXYPROGESTERONE ACETATE 150 MG/ML IM SUSP: 150.0000 mg | Freq: Once | INTRAMUSCULAR | 2 refills | Status: DC

## 2018-08-04 NOTE — Progress Notes (Signed)
   Subjective:    Patient ID: Shelia Martin, female    DOB: [birth year], 27 y.o.   MRN: [id number]  Patient presents for Contraception Management (would like to resume Depo- last shot 11/2017) and STD Check  Patient here to restart her Depo-Provera.  She is aware of how to give herself a shot.  She currently has a set of twins that are 7418 months old and a 26-year-old.  She is going to talk to her GYN about getting her tubes tied.  She would like to have STD screening.  She is not in a stable relationship at this time.  She does not have any symptoms.  Her menses ended yesterday.   Review Of Systems:  GEN- denies fatigue, fever, weight loss,weakness, recent illness HEENT- denies eye drainage, change in vision, nasal discharge, CVS- denies chest pain, palpitations RESP- denies SOB, cough, wheeze ABD- denies N/V, change in stools, abd pain GU- denies dysuria, hematuria, dribbling, incontinence MSK- denies joint pain, muscle aches, injury Neuro- denies headache, dizziness, syncope, seizure activity       Objective:    BP 118/62   Pulse 70   Temp 98.1 F (36.7 C) (Oral)   Resp 12   Ht 5\' 4"  (1.626 m)   Wt 136 lb (61.7 kg)   SpO2 99%   BMI 23.34 kg/m  GEN- NAD, alert and oriented x3 HEENT- PERRL, EOMI, non injected sclera, pink conjunctiva, MMM, oropharynx clear CVS- RRR, no murmur RESP-CTAB ABD-NABS,soft,NT,ND GU- normal external genitalia, vaginal mucosa pink and moist, cervix visualized no growth, no blood form os, minimal thin clear discharge, no CMT, no ovarian masses, uterus normal size EXT- No edema Pulses- Radial, DP- 2+   Urine pregnancy negative      Assessment & Plan:      Problem List Items Addressed This Visit      Unprioritized   Contraceptive management - Primary   Relevant Orders   Pregnancy, urine (Completed)    Other Visit Diagnoses    Screen for STD (sexually transmitted disease)       Screening per request, treat as needed   Relevant Orders    WET PREP FOR TRICH, YEAST, CLUE (Completed)   C. trachomatis/N. gonorrhoeae RNA   HIV Antibody (routine testing w rflx)   RPR      Note: This dictation was prepared with Dragon dictation along with smaller phrase technology. Any transcriptional errors that result from this process are unintentional.

## 2018-08-04 NOTE — Patient Instructions (Signed)
Depo sent to pharmacy We will call with results F/U as needed

## 2018-08-05 LAB — HIV ANTIBODY (ROUTINE TESTING W REFLEX): HIV 1&2 Ab, 4th Generation: NONREACTIVE

## 2018-08-05 LAB — C. TRACHOMATIS/N. GONORRHOEAE RNA
C. trachomatis RNA, TMA: NOT DETECTED
N. gonorrhoeae RNA, TMA: NOT DETECTED

## 2018-08-05 LAB — RPR: RPR Ser Ql: NONREACTIVE

## 2018-08-06 ENCOUNTER — Other Ambulatory Visit: Payer: Self-pay | Admitting: *Deleted

## 2018-08-06 MED ORDER — METRONIDAZOLE 500 MG PO TABS: 500.0000 mg | ORAL_TABLET | Freq: Two times a day (BID) | ORAL | 0 refills | Status: DC

## 2019-02-15 ENCOUNTER — Encounter: Payer: BLUE CROSS/BLUE SHIELD | Admitting: Family Medicine

## 2019-03-18 ENCOUNTER — Encounter: Payer: Medicaid Other | Admitting: Family Medicine

## 2019-04-26 ENCOUNTER — Other Ambulatory Visit: Payer: Self-pay | Admitting: Family Medicine

## 2019-07-25 ENCOUNTER — Encounter: Payer: Medicaid Other | Admitting: Family Medicine

## 2019-12-12 ENCOUNTER — Encounter: Payer: Medicaid Other | Admitting: Family Medicine

## 2020-01-25 ENCOUNTER — Telehealth: Payer: Self-pay | Admitting: Family Medicine

## 2020-01-25 NOTE — Telephone Encounter (Signed)
Patient called in with occasional voice hoarseness over the past few days. She states that her voice will go in and out. She has tried tea with no changes. She denied any other symptoms states she has no congestion. Advised that she could try and otc antihistamine and nasal spray and increase her fluid intake, and trying lozenges. Advised if symptoms worsen to schedule appointment. Patient verbalized understanding.

## 2020-02-13 ENCOUNTER — Other Ambulatory Visit: Payer: Self-pay | Admitting: Family Medicine

## 2020-02-13 ENCOUNTER — Other Ambulatory Visit: Payer: Self-pay

## 2020-02-13 NOTE — Telephone Encounter (Deleted)
Last refill: 04/26/2019

## 2020-02-28 ENCOUNTER — Telehealth: Payer: Self-pay | Admitting: Family Medicine

## 2020-02-28 NOTE — Telephone Encounter (Signed)
Cb# [phone] Pt would like for Dr. refill  medication for her exemia wasn't sure of the name

## 2020-03-01 NOTE — Telephone Encounter (Signed)
Patient requires OV.   Call placed to patient and patient made aware per VM.

## 2020-03-13 ENCOUNTER — Other Ambulatory Visit: Payer: Self-pay

## 2020-03-13 ENCOUNTER — Ambulatory Visit (INDEPENDENT_AMBULATORY_CARE_PROVIDER_SITE_OTHER): Payer: Medicaid Other | Admitting: Family Medicine

## 2020-03-13 ENCOUNTER — Encounter: Payer: Self-pay | Admitting: Family Medicine

## 2020-03-13 VITALS — BP 112/78 | HR 54 | Temp 98.3°F | Resp 16 | Ht 64.0 in | Wt 146.0 lb

## 2020-03-13 DIAGNOSIS — L2082 Flexural eczema: Secondary | ICD-10-CM | POA: Diagnosis not present

## 2020-03-13 DIAGNOSIS — Z124 Encounter for screening for malignant neoplasm of cervix: Secondary | ICD-10-CM

## 2020-03-13 DIAGNOSIS — Z3042 Encounter for surveillance of injectable contraceptive: Secondary | ICD-10-CM

## 2020-03-13 DIAGNOSIS — Z113 Encounter for screening for infections with a predominantly sexual mode of transmission: Secondary | ICD-10-CM

## 2020-03-13 LAB — WET PREP FOR TRICH, YEAST, CLUE

## 2020-03-13 MED ORDER — TRIAMCINOLONE ACETONIDE 0.1 % EX CREA
1.0000 | TOPICAL_CREAM | Freq: Two times a day (BID) | CUTANEOUS | 2 refills | Status: DC
Start: 2020-03-13 — End: 2020-07-30

## 2020-03-13 NOTE — Patient Instructions (Signed)
F/U as needed We will call with results   

## 2020-03-13 NOTE — Progress Notes (Signed)
   Subjective:    Patient ID: Shelia Martin, female    DOB: Aug 26, 1990, 29 y.o.   MRN: [id number]  Patient presents for Follow-up (is not fasting), STD Check, and Eczema (irritation to B inner elbows)  Patient here for STD screening.  She does have one partner but would like to be screened.  She has not had any vaginal irritation or discharge that is concerning.  She is on Depo Provera for contraception but does state towards the end of her 23-month cycle she does tend to get some spotting.  She would like to continue with that so as it works the best for her.  Also needs cream for her eczema.  She used a different type of soap and broke out significantly in the antecubital fossa's bilaterally.  She been self treating at home with emollients/Aveeno which helped lighten it some but she still has some itching and hyperpigmented scarring.  On review of chart she is overdue for PAP, last done  2017  Review Of Systems:  GEN- denies fatigue, fever, weight loss,weakness, recent illness HEENT- denies eye drainage, change in vision, nasal discharge, CVS- denies chest pain, palpitations RESP- denies SOB, cough, wheeze ABD- denies N/V, change in stools, abd pain GU- denies dysuria, hematuria, dribbling, incontinence MSK- denies joint pain, muscle aches, injury Neuro- denies headache, dizziness, syncope, seizure activity       Objective:    BP 112/78   Pulse 54   Temp 98.3 F (36.8 C) (Temporal)   Resp 16   Ht 5\' 4"  (1.626 m)   Wt 146 lb (66.2 kg)   SpO2 98%   BMI 25.06 kg/m  GEN- NAD, alert and oriented x3 HEENT- PERRL, EOMI, non injected sclera, pink conjunctiva,  CVS- RRR, no murmur RESP-CTAB ABD-NABS,soft,NT,ND GU- normal external genitalia, vaginal mucosa pink and moist, cervix visualized no growth, + blood form os, minimal thin clear discharge, no CMT, no ovarian masses, uterus normal size Skin- hyperpigmented ezeematous rash bilat anticubital fossa, +exocoriations  EXT- No  edema Pulses- Radial,  2+        Assessment & Plan:      Problem List Items Addressed This Visit      Unprioritized   Contraceptive management    We will continue Depo-Provera at this time.  Advised to spotting can occur with Depo-Provera if she can have breakthrough bleeding.  We will however do complete STD screening to rule out any other forms of vaginitis that could be contributing.      Eczema    We will add triamcinolone cream to her eczematous rash.  She will continue with the use of Aveeno or other eczema lotion.       Other Visit Diagnoses    Screen for STD (sexually transmitted disease)    -  Primary   Relevant Orders   HIV Antibody (routine testing w rflx)   Hepatitis C antibody   C. trachomatis/N. gonorrhoeae RNA   WET PREP FOR TRICH, YEAST, CLUE (Completed)   RPR   Cervical cancer screening       Relevant Orders   Pap IG w/ reflex to HPV when ASC-U      Note: This dictation was prepared with Dragon dictation along with smaller phrase technology. Any transcriptional errors that result from this process are unintentional.

## 2020-03-14 ENCOUNTER — Encounter: Payer: Self-pay | Admitting: Family Medicine

## 2020-03-14 ENCOUNTER — Telehealth: Payer: Self-pay | Admitting: Family Medicine

## 2020-03-14 LAB — PAP IG W/ RFLX HPV ASCU

## 2020-03-14 LAB — C. TRACHOMATIS/N. GONORRHOEAE RNA
C. trachomatis RNA, TMA: NOT DETECTED
N. gonorrhoeae RNA, TMA: NOT DETECTED

## 2020-03-14 LAB — HIV ANTIBODY (ROUTINE TESTING W REFLEX): HIV 1&2 Ab, 4th Generation: NONREACTIVE

## 2020-03-14 LAB — RPR: RPR Ser Ql: NONREACTIVE

## 2020-03-14 LAB — HEPATITIS C ANTIBODY
Hepatitis C Ab: NONREACTIVE
SIGNAL TO CUT-OFF: 0.01 (ref <1.00)

## 2020-03-14 NOTE — Assessment & Plan Note (Signed)
We will continue Depo-Provera at this time.  Advised to spotting can occur with Depo-Provera if she can have breakthrough bleeding.  We will however do complete STD screening to rule out any other forms of vaginitis that could be contributing.

## 2020-03-14 NOTE — Telephone Encounter (Signed)
CB# [phone] Call for lab results

## 2020-03-14 NOTE — Telephone Encounter (Signed)
Please see lab results for further documentation.  

## 2020-03-14 NOTE — Assessment & Plan Note (Signed)
We will add triamcinolone cream to her eczematous rash.  She will continue with the use of Aveeno or other eczema lotion.

## 2020-04-16 ENCOUNTER — Encounter: Payer: Medicaid Other | Admitting: Family Medicine

## 2020-06-06 ENCOUNTER — Encounter: Payer: Self-pay | Admitting: Family Medicine

## 2020-06-06 ENCOUNTER — Ambulatory Visit (INDEPENDENT_AMBULATORY_CARE_PROVIDER_SITE_OTHER): Payer: Medicaid Other | Admitting: Family Medicine

## 2020-06-06 ENCOUNTER — Other Ambulatory Visit: Payer: Self-pay

## 2020-06-06 VITALS — BP 126/70 | HR 85 | Temp 98.1°F | Ht 63.0 in | Wt 146.4 lb

## 2020-06-06 DIAGNOSIS — Z124 Encounter for screening for malignant neoplasm of cervix: Secondary | ICD-10-CM

## 2020-06-06 DIAGNOSIS — N912 Amenorrhea, unspecified: Secondary | ICD-10-CM | POA: Diagnosis not present

## 2020-06-06 DIAGNOSIS — B9689 Other specified bacterial agents as the cause of diseases classified elsewhere: Secondary | ICD-10-CM

## 2020-06-06 DIAGNOSIS — Z113 Encounter for screening for infections with a predominantly sexual mode of transmission: Secondary | ICD-10-CM | POA: Diagnosis not present

## 2020-06-06 DIAGNOSIS — N760 Acute vaginitis: Secondary | ICD-10-CM

## 2020-06-06 LAB — WET PREP FOR TRICH, YEAST, CLUE

## 2020-06-06 LAB — PREGNANCY, URINE: Preg Test, Ur: NEGATIVE

## 2020-06-06 NOTE — Progress Notes (Signed)
   Subjective:    Patient ID: Shelia Martin, female    DOB: 07-24-1991, 29 y.o.   MRN: [id number]  Patient presents for STD testing (Pt would like to have a preg test and std testing.)  Patient here for STD screening.  Should also like a pregnancy test.  She found out that her boyfriend and father of her children have been cheating on her.  She does not have any symptoms.  She has been off of Depo-Provera for 2 months states that she is not going to be sexually active does not want to go on any birth control at this time.  No menses due to Depo Provera  SHe is overdue for Pap smear so this will be obtained today as well.    Review Of Systems:  GEN- denies fatigue, fever, weight loss,weakness, recent illness HEENT- denies eye drainage, change in vision, nasal discharge, CVS- denies chest pain, palpitations RESP- denies SOB, cough, wheeze ABD- denies N/V, change in stools, abd pain GU- denies dysuria, hematuria, dribbling, incontinence MSK- denies joint pain, muscle aches, injury Neuro- denies headache, dizziness, syncope, seizure activity       Objective:    BP 126/70 (BP Location: Right Arm, Patient Position: Sitting, Cuff Size: Normal)   Pulse 85   Temp 98.1 F (36.7 C) (Oral)   Ht 5\' 3"  (1.6 m)   Wt 146 lb 6.4 oz (66.4 kg)   SpO2 99%   BMI 25.93 kg/m  GEN- NAD, alert and oriented x3 HEENT- PERRL, EOMI, non injected sclera, pink conjunctiva, MMM, oropharynx clear CVS- RRR, no murmur RESP-CTAB ABD-NABS,soft,NT,ND GU- normal external genitalia, vaginal mucosa pink and moist, cervix visualized no growth, small blood form os,+ discharge, no CMT, no ovarian masses, uterus normal size EXT- No edema Pulses- Radial, DP- 2+        Assessment & Plan:      Problem List Items Addressed This Visit    None    Visit Diagnoses    Amenorrhea    -  Primary   U preg negative, Depo still in system, declines contraception at this time   Relevant Orders   Pregnancy, urine  (Completed)   Screen for STD (sexually transmitted disease)       Screening done at pt request, no symptoms    Relevant Orders   HIV Antibody (routine testing w rflx)   RPR   C. trachomatis/N. gonorrhoeae RNA   WET PREP FOR TRICH, YEAST, CLUE (Completed)   Hepatitis C antibody   Cervical cancer screening       Relevant Orders   Pap IG w/ reflex to HPV when ASC-U      Note: This dictation was prepared with Dragon dictation along with smaller phrase technology. Any transcriptional errors that result from this process are unintentional.

## 2020-06-06 NOTE — Patient Instructions (Signed)
We will call with results F/U as needed  

## 2020-06-07 LAB — C. TRACHOMATIS/N. GONORRHOEAE RNA
C. trachomatis RNA, TMA: NOT DETECTED
N. gonorrhoeae RNA, TMA: NOT DETECTED

## 2020-06-07 LAB — RPR: RPR Ser Ql: NONREACTIVE

## 2020-06-07 LAB — PAP IG W/ RFLX HPV ASCU

## 2020-06-07 LAB — HEPATITIS C ANTIBODY
Hepatitis C Ab: NONREACTIVE
SIGNAL TO CUT-OFF: 0.01 (ref <1.00)

## 2020-06-07 LAB — HIV ANTIBODY (ROUTINE TESTING W REFLEX): HIV 1&2 Ab, 4th Generation: NONREACTIVE

## 2020-06-08 MED ORDER — METRONIDAZOLE 500 MG PO TABS: 500.0000 mg | ORAL_TABLET | Freq: Two times a day (BID) | ORAL | 0 refills | Status: DC

## 2020-06-08 NOTE — Addendum Note (Signed)
Addended by: Milinda Antis F on: 06/08/2020 01:31 PM   Modules accepted: Orders

## 2020-07-30 ENCOUNTER — Encounter: Payer: Self-pay | Admitting: Family Medicine

## 2020-07-30 ENCOUNTER — Other Ambulatory Visit: Payer: Self-pay

## 2020-07-30 ENCOUNTER — Ambulatory Visit (INDEPENDENT_AMBULATORY_CARE_PROVIDER_SITE_OTHER): Payer: Medicaid Other | Admitting: Family Medicine

## 2020-07-30 VITALS — BP 112/62 | HR 82 | Temp 98.0°F | Resp 14 | Ht 63.0 in | Wt 150.0 lb

## 2020-07-30 DIAGNOSIS — Z0000 Encounter for general adult medical examination without abnormal findings: Secondary | ICD-10-CM | POA: Diagnosis not present

## 2020-07-30 DIAGNOSIS — Z23 Encounter for immunization: Secondary | ICD-10-CM

## 2020-07-30 DIAGNOSIS — Z3042 Encounter for surveillance of injectable contraceptive: Secondary | ICD-10-CM

## 2020-07-30 DIAGNOSIS — Z13220 Encounter for screening for lipoid disorders: Secondary | ICD-10-CM | POA: Diagnosis not present

## 2020-07-30 DIAGNOSIS — Z833 Family history of diabetes mellitus: Secondary | ICD-10-CM

## 2020-07-30 MED ORDER — TRIAMCINOLONE ACETONIDE 0.1 % EX CREA
1.0000 | TOPICAL_CREAM | Freq: Two times a day (BID) | CUTANEOUS | 3 refills | Status: DC
Start: 2020-07-30 — End: 2020-10-10

## 2020-07-30 NOTE — Patient Instructions (Signed)
We will call with lab results F/U 1 year for Physical  

## 2020-07-30 NOTE — Progress Notes (Signed)
   Subjective:    Patient ID: [name], female    DOB: 05-Jun-1991, 29 y.o.   MRN: [id number]  Patient presents for Annual Exam (Is not fasting)  Pt here for CPE   Meds reviewed    No concerns today    had light menses last month  Using Depo Provera every 3 months she injects herself, no SE with meds, works well for her, she has 3 children    She is using MVI, elderberry, skin/hair vitamin    Eczema - uses steroid as needed   Dentist is UTD- she is interested to getting braces on top teeth     PAP Smear UTD done Oct 2021    Huntingtons disease Materal- Mat Grandmother anduncle    Due flu shot today   Recent STI check, no new partners   Immunizations- COVID-19 vaccine #2 due , due for FLu shot today               Review Of Systems:  GEN- denies fatigue, fever, weight loss,weakness, recent illness HEENT- denies eye drainage, change in vision, nasal discharge, CVS- denies chest pain, palpitations RESP- denies SOB, cough, wheeze ABD- denies N/V, change in stools, abd pain GU- denies dysuria, hematuria, dribbling, incontinence MSK- denies joint pain, muscle aches, injury Neuro- denies headache, dizziness, syncope, seizure activity       Objective:    BP 112/62   Pulse 82   Temp 98 F (36.7 C) (Temporal)   Resp 14   Ht 5\' 3"  (1.6 m)   Wt 150 lb (68 kg)   SpO2 100%   BMI 26.57 kg/m  GEN- NAD, alert and oriented x3 HEENT- PERRL, EOMI, non injected sclera, pink conjunctiva, MMM, oropharynx clear Neck- Supple, no thyromegaly CVS- RRR, no murmur RESP-CTAB ABD-NABS,soft,NT,ND EXT- No edema Pulses- Radial, DP- 2+  FALL/DEPRESSION/AUDIT C negative       Assessment & Plan:      Regarding Huntington since neither parent have gene and it is autosomal dominant she does not need genetic testing Problem List Items Addressed This Visit      Unprioritized   Contraceptive management    Continue Depo Provera       Other Visit Diagnoses     Routine general medical examination at a health care facility    -  Primary   CPE done, fastingl labs obtained, Flu shot given   Relevant Orders   CBC with Differential/Platelet (Completed)   Comprehensive metabolic panel (Completed)   Lipid panel (Completed)   TSH (Completed)   Screening cholesterol level       Family history of diabetes mellitus       Relevant Orders   Lipid panel (Completed)   Need for immunization against influenza       Relevant Orders   Flu Vaccine QUAD 36+ mos IM (Completed)      Note: This dictation was prepared with Dragon dictation along with smaller phrase technology. Any transcriptional errors that result from this process are unintentional.

## 2020-07-31 ENCOUNTER — Encounter: Payer: Self-pay | Admitting: Family Medicine

## 2020-07-31 LAB — COMPREHENSIVE METABOLIC PANEL
AG Ratio: 1.6 (calc) (ref 1.0–2.5)
ALT: 12 U/L (ref 6–29)
AST: 13 U/L (ref 10–30)
Albumin: 4.1 g/dL (ref 3.6–5.1)
Alkaline phosphatase (APISO): 52 U/L (ref 31–125)
BUN: 12 mg/dL (ref 7–25)
CO2: 27 mmol/L (ref 20–32)
Calcium: 9.5 mg/dL (ref 8.6–10.2)
Chloride: 103 mmol/L (ref 98–110)
Creat: 0.91 mg/dL (ref 0.50–1.10)
Globulin: 2.5 g/dL (calc) (ref 1.9–3.7)
Glucose, Bld: 65 mg/dL (ref 65–99)
Potassium: 3.7 mmol/L (ref 3.5–5.3)
Sodium: 139 mmol/L (ref 135–146)
Total Bilirubin: 0.4 mg/dL (ref 0.2–1.2)
Total Protein: 6.6 g/dL (ref 6.1–8.1)

## 2020-07-31 LAB — CBC WITH DIFFERENTIAL/PLATELET
Absolute Monocytes: 301 cells/uL (ref 200–950)
Basophils Absolute: 38 cells/uL (ref 0–200)
Basophils Relative: 0.6 %
Eosinophils Absolute: 288 cells/uL (ref 15–500)
Eosinophils Relative: 4.5 %
HCT: 39.7 % (ref 35.0–45.0)
Hemoglobin: 13.6 g/dL (ref 11.7–15.5)
Lymphs Abs: 2150 cells/uL (ref 850–3900)
MCH: 29.2 pg (ref 27.0–33.0)
MCHC: 34.3 g/dL (ref 32.0–36.0)
MCV: 85.4 fL (ref 80.0–100.0)
MPV: 10.9 fL (ref 7.5–12.5)
Monocytes Relative: 4.7 %
Neutro Abs: 3622 cells/uL (ref 1500–7800)
Neutrophils Relative %: 56.6 %
Platelets: 232 10*3/uL (ref 140–400)
RBC: 4.65 10*6/uL (ref 3.80–5.10)
RDW: 12.4 % (ref 11.0–15.0)
Total Lymphocyte: 33.6 %
WBC: 6.4 10*3/uL (ref 3.8–10.8)

## 2020-07-31 LAB — LIPID PANEL
Cholesterol: 142 mg/dL (ref <200)
HDL: 44 mg/dL — ABNORMAL LOW (ref >=50)
LDL Cholesterol (Calc): 77 mg/dL (calc)
Non-HDL Cholesterol (Calc): 98 mg/dL (calc) (ref <130)
Total CHOL/HDL Ratio: 3.2 (calc) (ref <5.0)
Triglycerides: 133 mg/dL (ref <150)

## 2020-07-31 LAB — TSH: TSH: 0.57 mIU/L

## 2020-07-31 MED ORDER — MEDROXYPROGESTERONE ACETATE 150 MG/ML IM SUSP: 150.0000 mg | Freq: Once | INTRAMUSCULAR | 2 refills | Status: DC

## 2020-07-31 NOTE — Assessment & Plan Note (Signed)
Continue Depo Provera 

## 2020-08-01 ENCOUNTER — Telehealth: Payer: Self-pay

## 2020-08-01 NOTE — Telephone Encounter (Signed)
Spoke with pt regarding lab results, verbalized understanding

## 2020-08-13 ENCOUNTER — Ambulatory Visit (INDEPENDENT_AMBULATORY_CARE_PROVIDER_SITE_OTHER): Payer: Medicaid Other | Admitting: Family Medicine

## 2020-08-13 DIAGNOSIS — Z1152 Encounter for screening for COVID-19: Secondary | ICD-10-CM

## 2020-08-15 LAB — SARS-COV-2 RNA,(COVID-19) QUALITATIVE NAAT: SARS CoV2 RNA: NOT DETECTED

## 2020-10-02 ENCOUNTER — Other Ambulatory Visit: Payer: Self-pay

## 2020-10-02 ENCOUNTER — Ambulatory Visit (INDEPENDENT_AMBULATORY_CARE_PROVIDER_SITE_OTHER): Payer: Medicaid Other | Admitting: Family Medicine

## 2020-10-02 VITALS — BP 122/70 | HR 84 | Temp 98.2°F | Resp 16 | Ht 63.0 in | Wt 159.0 lb

## 2020-10-02 DIAGNOSIS — Z7689 Persons encountering health services in other specified circumstances: Secondary | ICD-10-CM

## 2020-10-02 LAB — WET PREP FOR TRICH, YEAST, CLUE

## 2020-10-02 MED ORDER — METRONIDAZOLE 500 MG PO TABS: 500.0000 mg | ORAL_TABLET | Freq: Two times a day (BID) | ORAL | 0 refills | Status: DC

## 2020-10-02 NOTE — Progress Notes (Signed)
Subjective:    Patient ID: Shelia Martin, female    DOB: [birth year], 30 y.o.   MRN: [id number]  HPI  Patient recently discovered that her partner was unfaithful and that she may have been exposed to sexually transmitted infections.  She also requests a pregnancy test.  She denies any vaginal discharge or pelvic pain or fever or chills or vaginal bleeding.  However she would like STD screening and a pregnancy test Past Medical History:  Diagnosis Date  . Eczema    Past Surgical History:  Procedure Laterality Date  . CESAREAN SECTION MULTI-GESTATIONAL N/A 10/31/2016   Procedure: CESAREAN SECTION MULTI-GESTATIONAL;  Surgeon: Philip Aspen, DO;  Location: [facility];  Service: Obstetrics;  Laterality: N/A;   Dr Mora Appl to assist  . NO PAST SURGERIES     Current Outpatient Medications on File Prior to Visit  Medication Sig Dispense Refill  . cetirizine (ZYRTEC) 10 MG chewable tablet Chew 10 mg by mouth daily.    Marland Kitchen triamcinolone (KENALOG) 0.1 % Apply 1 application topically 2 (two) times daily. 30 g 3  . medroxyPROGESTERone (DEPO-PROVERA) 150 MG/ML injection Inject 1 mL (150 mg total) into the muscle once for 1 dose. 1 mL 2   No current facility-administered medications on file prior to visit.   No Known Allergies Social History   Socioeconomic History  . Marital status: Single    Spouse name: Not on file  . Number of children: Not on file  . Years of education: Not on file  . Highest education level: Not on file  Occupational History  . Not on file  Tobacco Use  . Smoking status: Never Smoker  . Smokeless tobacco: Never Used  Substance and Sexual Activity  . Alcohol use: No  . Drug use: No  . Sexual activity: Yes    Partners: Male  Other Topics Concern  . Not on file  Social History Narrative  . Not on file   Social Determinants of Health   Financial Resource Strain: Not on file  Food Insecurity: Not on file  Transportation Needs: Not on file  Physical  Activity: Not on file  Stress: Not on file  Social Connections: Not on file  Intimate Partner Violence: Not on file     Review of Systems  All other systems reviewed and are negative.      Objective:   Physical Exam Vitals reviewed. Exam conducted with a chaperone present.  Constitutional:      Appearance: Normal appearance.  Cardiovascular:     Rate and Rhythm: Normal rate and regular rhythm.     Heart sounds: Normal heart sounds.  Pulmonary:     Effort: Pulmonary effort is normal.     Breath sounds: Normal breath sounds.  Abdominal:     General: Bowel sounds are normal. There is no distension.     Palpations: Abdomen is soft.     Tenderness: There is no abdominal tenderness.  Genitourinary:    Cervix: No cervical motion tenderness.     Uterus: Normal.      Adnexa: Right adnexa normal and left adnexa normal.  Neurological:     Mental Status: She is alert.           Assessment & Plan:  Encounter for assessment of STD exposure - Plan: WET PREP FOR TRICH, YEAST, CLUE, HIV Antibody (routine testing w rflx), RPR, hCG, serum, qualitative, C. trachomatis/N. gonorrhoeae RNA, CANCELED: C. trachomatis/N. gonorrhoeae RNA  Pelvic exam shows a thick white discharge.  Wet prep shows clue cells.  Treat with Flagyl 500 mg p.o. twice daily for 7 days.  Await the results of STD testing and pregnancy test.

## 2020-10-03 LAB — C. TRACHOMATIS/N. GONORRHOEAE RNA
C. trachomatis RNA, TMA: NOT DETECTED
N. gonorrhoeae RNA, TMA: NOT DETECTED

## 2020-10-04 LAB — RPR: RPR Ser Ql: NONREACTIVE

## 2020-10-04 LAB — HCG, SERUM, QUALITATIVE: Preg, Serum: NEGATIVE

## 2020-10-04 LAB — C. TRACHOMATIS/N. GONORRHOEAE RNA

## 2020-10-04 LAB — HIV ANTIBODY (ROUTINE TESTING W REFLEX): HIV 1&2 Ab, 4th Generation: NONREACTIVE

## 2020-10-10 ENCOUNTER — Telehealth: Payer: Self-pay | Admitting: Family Medicine

## 2020-10-10 MED ORDER — TRIAMCINOLONE ACETONIDE 0.1 % EX CREA: 1.0000 "application " | TOPICAL_CREAM | Freq: Two times a day (BID) | CUTANEOUS | 3 refills | Status: DC

## 2020-10-10 NOTE — Telephone Encounter (Signed)
Kenalog refill

## 2021-01-09 ENCOUNTER — Ambulatory Visit: Payer: Medicaid Other | Admitting: Nurse Practitioner

## 2021-01-09 ENCOUNTER — Encounter: Payer: Self-pay | Admitting: Nurse Practitioner

## 2021-01-09 ENCOUNTER — Other Ambulatory Visit: Payer: Self-pay

## 2021-01-09 VITALS — BP 104/64 | HR 71 | Ht 63.0 in | Wt 157.2 lb

## 2021-01-09 DIAGNOSIS — Z113 Encounter for screening for infections with a predominantly sexual mode of transmission: Secondary | ICD-10-CM | POA: Diagnosis not present

## 2021-01-09 NOTE — Progress Notes (Signed)
    Subjective:    Patient ID: Shelia Martin, female    DOB: 1990/08/20, 30 y.o.   MRN: [id number]  HPI: Shelia Martin is a 30 y.o. female presenting for STD screening.  Chief Complaint  Patient presents with  . screening for std    Does not feel like she's been exposed, just wanting to be chked   STD SCREENING Sexual activity:  Not currentrly sexually active Contraception: no Recent unprotected intercourse: no History of sexually transmitted diseases: yes; chlamydia in past Previous sexually transmitted disease screening: no Genital lesions: no Vaginal discharge: no Dysuria: no Swollen lymph nodes: no Fevers: no Rash: no  No Known Allergies  Outpatient Encounter Medications as of 01/09/2021  Medication Sig  . cetirizine (ZYRTEC) 10 MG chewable tablet Chew 10 mg by mouth daily.  Marland Kitchen triamcinolone (KENALOG) 0.1 % Apply 1 application topically 2 (two) times daily.  . medroxyPROGESTERone (DEPO-PROVERA) 150 MG/ML injection Inject 1 mL (150 mg total) into the muscle once for 1 dose.  . [DISCONTINUED] metroNIDAZOLE (FLAGYL) 500 MG tablet Take 1 tablet (500 mg total) by mouth 2 (two) times daily.   No facility-administered encounter medications on file as of 01/09/2021.    Patient Active Problem List   Diagnosis Date Noted  . Eczema 06/05/2013  . Contraceptive management 05/04/2013    Past Medical History:  Diagnosis Date  . Eczema     Relevant past medical, surgical, family and social history reviewed and updated as indicated. Interim medical history since our last visit reviewed.  Review of Systems Per HPI unless specifically indicated above     Objective:    BP 104/64   Pulse 71   Ht 5\' 3"  (1.6 m)   Wt 157 lb 3.2 oz (71.3 kg)   LMP 01/09/2021   SpO2 97%   BMI 27.85 kg/m   Wt Readings from Last 3 Encounters:  01/09/21 157 lb 3.2 oz (71.3 kg)  10/02/20 159 lb (72.1 kg)  07/30/20 150 lb (68 kg)    Physical Exam Vitals and nursing note reviewed.   Constitutional:      General: She is not in acute distress.    Appearance: Normal appearance. She is not toxic-appearing.  Genitourinary:    Comments: Deferred using shared decision making Skin:    General: Skin is warm and dry.     Coloration: Skin is not jaundiced or pale.     Findings: No erythema.  Neurological:     Mental Status: She is alert and oriented to person, place, and time.  Psychiatric:        Mood and Affect: Mood normal.        Behavior: Behavior normal.        Thought Content: Thought content normal.        Judgment: Judgment normal.       Assessment & Plan:   Problem List Items Addressed This Visit   None   Visit Diagnoses    Screening for STD (sexually transmitted disease)    -  Primary   Screenings completed today per patient request.  Will inform of results.   Relevant Orders   C. trachomatis/N. gonorrhoeae RNA   HIV Antibody (routine testing w rflx)   RPR   Trichomonas vaginalis, RNA       Follow up plan: Return if symptoms worsen or fail to improve.

## 2021-01-10 LAB — C. TRACHOMATIS/N. GONORRHOEAE RNA
C. trachomatis RNA, TMA: NOT DETECTED
N. gonorrhoeae RNA, TMA: NOT DETECTED

## 2021-01-10 LAB — SYPHILIS: RPR W/REFLEX TO RPR TITER AND TREPONEMAL ANTIBODIES, TRADITIONAL SCREENING AND DIAGNOSIS ALGORITHM: RPR Ser Ql: NONREACTIVE

## 2021-01-10 LAB — HIV ANTIBODY (ROUTINE TESTING W REFLEX): HIV 1&2 Ab, 4th Generation: NONREACTIVE

## 2021-01-10 LAB — TRICHOMONAS VAGINALIS, PROBE AMP: Trichomonas vaginalis RNA: NOT DETECTED

## 2021-03-20 NOTE — Progress Notes (Deleted)
    Subjective:    Patient ID: [name], female    DOB: [birth year], 30 y.o.   MRN: [id number]  HPI: KELSEE PRESLAR is a 30 y.o. female presenting for  No chief complaint on file.  CONTRACEPTION Contraception: Depo-Provera Previous contraception:  Sexual activity:  Gravida/Para:  Menarche at age:  Average interval between menses:  Length of menses:  Flow: Dysmenorrhea:   No Known Allergies  Outpatient Encounter Medications as of 03/21/2021  Medication Sig   cetirizine (ZYRTEC) 10 MG chewable tablet Chew 10 mg by mouth daily.   medroxyPROGESTERone (DEPO-PROVERA) 150 MG/ML injection Inject 1 mL (150 mg total) into the muscle once for 1 dose.   triamcinolone (KENALOG) 0.1 % Apply 1 application topically 2 (two) times daily.   No facility-administered encounter medications on file as of 03/21/2021.    Patient Active Problem List   Diagnosis Date Noted   Eczema 06/05/2013   Contraceptive management 05/04/2013    Past Medical History:  Diagnosis Date   Eczema     Relevant past medical, surgical, family and social history reviewed and updated as indicated. Interim medical history since our last visit reviewed.  Review of Systems Per HPI unless specifically indicated above     Objective:    There were no vitals taken for this visit.  Wt Readings from Last 3 Encounters:  01/09/21 157 lb 3.2 oz (71.3 kg)  10/02/20 159 lb (72.1 kg)  07/30/20 150 lb (68 kg)    Physical Exam  Results for orders placed or performed in visit on 01/09/21  C. trachomatis/N. gonorrhoeae RNA   Specimen: Urine  Result Value Ref Range   C. trachomatis RNA, TMA NOT DETECTED NOT DETECTED   N. gonorrhoeae RNA, TMA NOT DETECTED NOT DETECTED  Trichomonas vaginalis, RNA   Specimen: Urine  Result Value Ref Range   Trichomonas vaginalis RNA NOT DETECTED NOT DETECTED  HIV Antibody (routine testing w rflx)  Result Value Ref Range   HIV 1&2 Ab, 4th Generation NON-REACTIVE NON-REACTIVE   RPR  Result Value Ref Range   RPR Ser Ql NON-REACTIVE NON-REACTIVE      Assessment & Plan:   Problem List Items Addressed This Visit   None    Follow up plan: No follow-ups on file.

## 2021-03-21 ENCOUNTER — Ambulatory Visit: Payer: Self-pay | Admitting: Nurse Practitioner

## 2021-03-25 NOTE — Progress Notes (Signed)
Subjective:    Patient ID: Shelia Martin, female    DOB: Mar 28, 1991, 30 y.o.   MRN: [id number]  HPI: [name] is a 30 y.o. female presenting for  Chief Complaint  Patient presents with   Contraception    Injection  for birth control   Medication Refill    Triamcilone cream   CONTRACEPTION Contraception: none currently Previous contraception: Depo-Provider Sexual activity: Yes; currently sexually active  Menarche at age: 51 Average interval between menses: 28 days Length of menses: 7 days Flow: "not bad" Dysmenorrhea:no; only back pain on both sides Smoking: no Migraines: no History of breast cancer: no Family history of breast cancer: no Recently unprotected sexual intercourse: yes; desires STI screening today Genital lesions: no Vaginal discharge: no  No Known Allergies  Outpatient Encounter Medications as of 03/27/2021  Medication Sig   medroxyPROGESTERone Acetate (DEPO-PROVERA) 150 MG/ML SUSY Inject 1 mL (150 mg total) into the muscle every 3 (three) months for 1 dose.   triamcinolone (KENALOG) 0.1 % Apply 1 application topically 2 (two) times daily.   cetirizine (ZYRTEC) 10 MG chewable tablet Chew 10 mg by mouth daily.   [DISCONTINUED] medroxyPROGESTERone (DEPO-PROVERA) 150 MG/ML injection Inject 1 mL (150 mg total) into the muscle once for 1 dose.   No facility-administered encounter medications on file as of 03/27/2021.    Patient Active Problem List   Diagnosis Date Noted   Eczema 06/05/2013   Contraceptive management 05/04/2013    Past Medical History:  Diagnosis Date   Eczema     Relevant past medical, surgical, family and social history reviewed and updated as indicated. Interim medical history since our last visit reviewed.  Review of Systems  Constitutional: Negative.   Respiratory: Negative.    Cardiovascular: Negative.   Genitourinary: Negative.   Skin: Negative.   Neurological: Negative.   Psychiatric/Behavioral: Negative.     Per HPI unless specifically indicated above     Objective:    BP 98/72   Pulse 84   Temp 98.9 F (37.2 C)   Ht 5\' 3"  (1.6 m)   Wt 156 lb 6.4 oz (70.9 kg)   LMP 03/21/2021 Comment: -  SpO2 98%   BMI 27.71 kg/m   Wt Readings from Last 3 Encounters:  03/27/21 156 lb 6.4 oz (70.9 kg)  01/09/21 157 lb 3.2 oz (71.3 kg)  10/02/20 159 lb (72.1 kg)    Physical Exam Vitals reviewed.  Constitutional:      General: She is not in acute distress.    Appearance: Normal appearance. She is not toxic-appearing.  Cardiovascular:     Heart sounds: No murmur heard. Pulmonary:     Effort: Pulmonary effort is normal. No respiratory distress.     Breath sounds: Normal breath sounds. No wheezing, rhonchi or rales.  Skin:    General: Skin is warm and dry.     Coloration: Skin is not jaundiced or pale.     Findings: No erythema.  Neurological:     Mental Status: She is alert and oriented to person, place, and time.     Motor: No weakness.     Gait: Gait normal.  Psychiatric:        Mood and Affect: Mood normal.        Behavior: Behavior normal.        Thought Content: Thought content normal.        Judgment: Judgment normal.      Assessment & Plan:   Problem List  Items Addressed This Visit       Other   Contraceptive management - Primary    Desires Depo-Provera.  Pregnancy test negative today and no contraindications - will start Depo-Provera.  Discussed that this needs to be given into muscle every 3 months.  Will also screen for STI today per patient's request.  Follow up yearly or sooner with any concerns.       Relevant Medications   medroxyPROGESTERone Acetate (DEPO-PROVERA) 150 MG/ML SUSY   Other Relevant Orders   Pregnancy, urine (Completed)   Other Visit Diagnoses     Screening for STD (sexually transmitted disease)       Relevant Orders   HIV Antibody (routine testing w rflx)   RPR   C. trachomatis/N. gonorrhoeae RNA   Trichomonas vaginalis, RNA        Follow  up plan: Return in about 1 year (around 03/27/2022) for contraception.

## 2021-03-27 ENCOUNTER — Encounter: Payer: Self-pay | Admitting: Nurse Practitioner

## 2021-03-27 ENCOUNTER — Other Ambulatory Visit: Payer: Self-pay

## 2021-03-27 ENCOUNTER — Ambulatory Visit: Payer: Medicaid Other | Admitting: Nurse Practitioner

## 2021-03-27 VITALS — BP 98/72 | HR 84 | Temp 98.9°F | Ht 63.0 in | Wt 156.4 lb

## 2021-03-27 DIAGNOSIS — Z113 Encounter for screening for infections with a predominantly sexual mode of transmission: Secondary | ICD-10-CM | POA: Diagnosis not present

## 2021-03-27 DIAGNOSIS — Z3042 Encounter for surveillance of injectable contraceptive: Secondary | ICD-10-CM

## 2021-03-27 LAB — PREGNANCY, URINE: Preg Test, Ur: NEGATIVE

## 2021-03-27 MED ORDER — MEDROXYPROGESTERONE ACETATE 150 MG/ML IM SUSY: 150.0000 mg | PREFILLED_SYRINGE | INTRAMUSCULAR | 3 refills | Status: DC

## 2021-03-27 NOTE — Assessment & Plan Note (Signed)
Desires Depo-Provera.  Pregnancy test negative today and no contraindications - will start Depo-Provera.  Discussed that this needs to be given into muscle every 3 months.  Will also screen for STI today per patient's request.  Follow up yearly or sooner with any concerns.

## 2021-03-28 LAB — C. TRACHOMATIS/N. GONORRHOEAE RNA
C. trachomatis RNA, TMA: NOT DETECTED
N. gonorrhoeae RNA, TMA: NOT DETECTED

## 2021-03-28 LAB — RPR: RPR Ser Ql: NONREACTIVE

## 2021-03-28 LAB — HIV ANTIBODY (ROUTINE TESTING W REFLEX): HIV 1&2 Ab, 4th Generation: NONREACTIVE

## 2021-03-28 LAB — TRICHOMONAS VAGINALIS, PROBE AMP: Trichomonas vaginalis RNA: NOT DETECTED

## 2021-07-22 ENCOUNTER — Telehealth: Payer: Self-pay

## 2021-07-22 NOTE — Telephone Encounter (Signed)
Printed left voicemail letting patient know its up front for pickup

## 2021-07-22 NOTE — Telephone Encounter (Signed)
Pt called in requesting a copy of her immunization record. Please call when ready to pick up.  Cb#: [phone]

## 2021-09-19 ENCOUNTER — Ambulatory Visit: Payer: Medicaid Other | Admitting: Nurse Practitioner

## 2021-10-24 ENCOUNTER — Telehealth: Payer: Self-pay | Admitting: Nurse Practitioner

## 2021-10-24 ENCOUNTER — Ambulatory Visit (INDEPENDENT_AMBULATORY_CARE_PROVIDER_SITE_OTHER): Payer: Medicaid Other | Admitting: Nurse Practitioner

## 2021-10-24 ENCOUNTER — Other Ambulatory Visit: Payer: Self-pay

## 2021-10-24 DIAGNOSIS — L500 Allergic urticaria: Secondary | ICD-10-CM

## 2021-10-24 MED ORDER — HYDROXYZINE HCL 10 MG PO TABS: 10.0000 mg | ORAL_TABLET | Freq: Three times a day (TID) | ORAL | 0 refills | Status: DC | PRN

## 2021-10-24 NOTE — Progress Notes (Signed)
Virtual Visit via Telephone Note ? ?I connected with Shelia Martin on 10/24/21 at  2:20 PM EST by telephone and verified that I am speaking with the correct person using two identifiers. ? ?Location: ?Patient: home ?Provider: office ?  ?I discussed the limitations, risks, security and privacy concerns of performing an evaluation and management service by telephone and the availability of in person appointments. I also discussed with the patient that there may be a patient responsible charge related to this service. The patient expressed understanding and agreed to proceed. ? ? ?History of Present Illness: ? ? ?Patient presents today to establish care.  She states that she is not currently on any prescription medications.  She states that she has no significant health history.  She states that for the past few weeks she has been having intermittent outbreaks of hives.  She associates this with possible seasonal allergies.  Patient has been taking Zyrtec.  She states that she has had this issue in the past and was prescribed hydroxyzine.  She states that this worked well for her.  We discussed that we can order this again for her today.  Denies f/c/s, n/v/d, hemoptysis, PND, chest pain or edema. ? ? ? ? ?  ?Observations/Objective: ? ?Vitals with BMI 03/27/2021 01/09/2021 10/02/2020  ?Height 5\' 3"  5\' 3"  5\' 3"   ?Weight 156 lbs 6 oz 157 lbs 3 oz 159 lbs  ?BMI 27.71 27.85 28.17  ?Systolic 98 [id number]  ?Diastolic 72 64 70  ?Pulse 84 71 84  ? ? ? ? ?Assessment and Plan: ? ?Patient Instructions  ?1. Allergic urticaria ? ?- hydrOXYzine (ATARAX) 10 MG tablet; Take 1 tablet (10 mg total) by mouth 3 (three) times daily as needed.  Dispense: 30 tablet; Refill: 0 ? ?Follow up: ? ?Follow up if needed ? ?Hives ?Hives are itchy, red, swollen areas on your skin. Hives can show up on any part of your body. Hives often fade within 24 hours (acute hives). New hives can show up after old ones fade. This can go on for many days or weeks  (chronic hives). Hives do not spread from person to person (are not contagious). ?Hives are caused by your body's response to something that you are allergic to (allergen). These are sometimes called triggers. You can get hives right after being around a trigger, or hours later. ?What are the causes? ?Allergies to foods. ?Insect bites or stings. ?Exposure to pollen or pets. ?Spending time in sunlight, heat, or cold. ?Exercise. ?Stress. ?You can also get hives from other medical conditions and treatments, such as: ?Some medicines. ?Chemicals or latex. ?Viruses. This includes the common cold. ?Infections caused by germs (bacteria). ?Allergy shots. ?Blood transfusions. ?Sometimes, the cause is not known. ?What increases the risk? ?Being a woman. ?Being allergic to foods such as: ?Citrus fruits. ?Milk. ?Eggs. ?Peanuts. ?Tree nuts. ?Shellfish. ?Being allergic to: ?Medicines. ?Latex. ?Insects. ?Animals. ?Pollen. ?What are the signs or symptoms? ? ?Raised, itchy, red or white bumps or patches on your skin. These areas may: ?Get large and swollen. ?Change in shape and location. ?Stand alone or connect to each other over a large area of skin. ?Sting or hurt. ?Turn white when pressed in the center (blanch). ?In very bad cases, your hands, feet, and face may also get swollen. This may happen if hives start deeper in your skin. ?How is this treated? ?Treatment for this condition depends on your symptoms. Treatment may include: ?Using cool, wet cloths (cool compresses) or taking cool showers  to stop the itching. ?Medicines that help: ?Relieve itching (antihistamines). ?Reduce swelling (corticosteroids). ?Treat infection (antibiotics). ?A medicine (omalizumab) that is given as a shot (injection). Your doctor may prescribe this if you have hives that do not get better even after other treatments. ?In very bad cases, you may need a shot of a medicine called epinephrine to prevent a life-threatening allergic reaction  (anaphylaxis). ?Follow these instructions at home: ?Medicines ?Take or apply over-the-counter and prescription medicines only as told by your doctor. ?If you were prescribed an antibiotic medicine, use it as told by your doctor. Do not stop using it even if you start to feel better. ?Skin care ?Apply cool, wet cloths to the hives. ?Do not scratch your skin. Do not rub your skin. ?General instructions ?Do not take hot showers or baths. This can make itching worse. ?Do not wear tight clothes. ?Use sunscreen and wear clothes that cover your skin when you are outside. ?Avoid any triggers that cause your hives. Keep a journal to help track what causes your hives. Write down: ?What medicines you take. ?What you eat and drink. ?What products you use on your skin. ?Keep all follow-up visits as told by your doctor. This is important. ?Contact a doctor if: ?Your symptoms are not better with medicine. ?Your joints hurt or are swollen. ?Get help right away if: ?You have a fever. ?You have pain in your belly (abdomen). ?Your tongue or lips are swollen. ?Your eyelids are swollen. ?Your chest or throat feels tight. ?You have trouble breathing or swallowing. ?These symptoms may be an emergency. Do not wait to see if the symptoms will go away. Get medical help right away. Call your local emergency services (911 in the [place] Do not drive yourself to the hospital. ?Summary ?Hives are itchy, red, swollen areas on your skin. ?Treatment for this condition depends on your symptoms. ?Avoid things that cause your hives. Keep a journal to help track what causes your hives. ?Take and apply over-the-counter and prescription medicines only as told by your doctor. ?Get help right away if your chest or throat feels tight or if you have trouble breathing or swallowing. ?This information is not intended to replace advice given to you by your health care provider. Make sure you discuss any questions you have with your health care  provider. ?Document Revised: 09/23/2020 Document Reviewed: 09/23/2020 ?Elsevier Patient Education ? 2022 [place]. ? ? ? ?  ?I discussed the assessment and treatment plan with the patient. The patient was provided an opportunity to ask questions and all were answered. The patient agreed with the plan and demonstrated an understanding of the instructions. ?  ?The patient was advised to call back or seek an in-person evaluation if the symptoms worsen or if the condition fails to improve as anticipated. ? ?I provided 24 minutes of non-face-to-face time during this encounter. ? ? ?Fenton Foy, NP ? ?

## 2021-10-28 ENCOUNTER — Encounter: Payer: Self-pay | Admitting: Nurse Practitioner

## 2021-10-28 NOTE — Patient Instructions (Signed)
1. Allergic urticaria ? ?- hydrOXYzine (ATARAX) 10 MG tablet; Take 1 tablet (10 mg total) by mouth 3 (three) times daily as needed.  Dispense: 30 tablet; Refill: 0 ? ?Follow up: ? ?Follow up if needed ? ?Hives ?Hives are itchy, red, swollen areas on your skin. Hives can show up on any part of your body. Hives often fade within 24 hours (acute hives). New hives can show up after old ones fade. This can go on for many days or weeks (chronic hives). Hives do not spread from person to person (are not contagious). ?Hives are caused by your body's response to something that you are allergic to (allergen). These are sometimes called triggers. You can get hives right after being around a trigger, or hours later. ?What are the causes? ?Allergies to foods. ?Insect bites or stings. ?Exposure to pollen or pets. ?Spending time in sunlight, heat, or cold. ?Exercise. ?Stress. ?You can also get hives from other medical conditions and treatments, such as: ?Some medicines. ?Chemicals or latex. ?Viruses. This includes the common cold. ?Infections caused by germs (bacteria). ?Allergy shots. ?Blood transfusions. ?Sometimes, the cause is not known. ?What increases the risk? ?Being a woman. ?Being allergic to foods such as: ?Citrus fruits. ?Milk. ?Eggs. ?Peanuts. ?Tree nuts. ?Shellfish. ?Being allergic to: ?Medicines. ?Latex. ?Insects. ?Animals. ?Pollen. ?What are the signs or symptoms? ? ?Raised, itchy, red or white bumps or patches on your skin. These areas may: ?Get large and swollen. ?Change in shape and location. ?Stand alone or connect to each other over a large area of skin. ?Sting or hurt. ?Turn white when pressed in the center (blanch). ?In very bad cases, your hands, feet, and face may also get swollen. This may happen if hives start deeper in your skin. ?How is this treated? ?Treatment for this condition depends on your symptoms. Treatment may include: ?Using cool, wet cloths (cool compresses) or taking cool showers to stop the  itching. ?Medicines that help: ?Relieve itching (antihistamines). ?Reduce swelling (corticosteroids). ?Treat infection (antibiotics). ?A medicine (omalizumab) that is given as a shot (injection). Your doctor may prescribe this if you have hives that do not get better even after other treatments. ?In very bad cases, you may need a shot of a medicine called epinephrine to prevent a life-threatening allergic reaction (anaphylaxis). ?Follow these instructions at home: ?Medicines ?Take or apply over-the-counter and prescription medicines only as told by your doctor. ?If you were prescribed an antibiotic medicine, use it as told by your doctor. Do not stop using it even if you start to feel better. ?Skin care ?Apply cool, wet cloths to the hives. ?Do not scratch your skin. Do not rub your skin. ?General instructions ?Do not take hot showers or baths. This can make itching worse. ?Do not wear tight clothes. ?Use sunscreen and wear clothes that cover your skin when you are outside. ?Avoid any triggers that cause your hives. Keep a journal to help track what causes your hives. Write down: ?What medicines you take. ?What you eat and drink. ?What products you use on your skin. ?Keep all follow-up visits as told by your doctor. This is important. ?Contact a doctor if: ?Your symptoms are not better with medicine. ?Your joints hurt or are swollen. ?Get help right away if: ?You have a fever. ?You have pain in your belly (abdomen). ?Your tongue or lips are swollen. ?Your eyelids are swollen. ?Your chest or throat feels tight. ?You have trouble breathing or swallowing. ?These symptoms may be an emergency. Do not wait to  see if the symptoms will go away. Get medical help right away. Call your local emergency services (911 in the [place] Do not drive yourself to the hospital. ?Summary ?Hives are itchy, red, swollen areas on your skin. ?Treatment for this condition depends on your symptoms. ?Avoid things that cause your hives. Keep a  journal to help track what causes your hives. ?Take and apply over-the-counter and prescription medicines only as told by your doctor. ?Get help right away if your chest or throat feels tight or if you have trouble breathing or swallowing. ?This information is not intended to replace advice given to you by your health care provider. Make sure you discuss any questions you have with your health care provider. ?Document Revised: 09/23/2020 Document Reviewed: 09/23/2020 ?Elsevier Patient Education ? 2022 [employer] ? ?

## 2021-11-27 ENCOUNTER — Other Ambulatory Visit: Payer: Self-pay | Admitting: Family Medicine

## 2021-11-27 DIAGNOSIS — L500 Allergic urticaria: Secondary | ICD-10-CM

## 2021-11-27 NOTE — Telephone Encounter (Signed)
Medication Refill - Medication: hydrOXYzine (ATARAX) 10 MG tablet  ? ?Has the patient contacted their pharmacy? Yes.   ?(Agent: If no, request that the patient contact the pharmacy for the refill. If patient does not wish to contact the pharmacy document the reason why and proceed with request.) ?(Agent: If yes, when and what did the pharmacy advise?) ? ?Preferred Pharmacy (with phone number or street name):  ?[id number] - [place], [place] - [place]  ?[place] [address]  ?Phone: [phone] Fax: [phone]  ? ?Has the patient been seen for an appointment in the last year OR does the patient have an upcoming appointment? Yes.   ? ?Agent: Please be advised that RX refills may take up to 3 business days. We ask that you follow-up with your pharmacy. ? ?

## 2021-11-28 NOTE — Telephone Encounter (Signed)
Requested medication (s) are due for refill today: Yes ? ?Requested medication (s) are on the active medication list: Yes ? ?Last refill:  10/24/21 ? ?Future visit scheduled: No ? ?Notes to clinic:  Last office note states pt. Has established care. ? ? ? ?Requested Prescriptions  ?Pending Prescriptions Disp Refills  ? hydrOXYzine (ATARAX) 10 MG tablet 30 tablet 0  ?  Sig: Take 1 tablet (10 mg total) by mouth 3 (three) times daily as needed.  ?  ? Ear, Nose, and Throat:  Antihistamines 2 Failed - 11/27/2021 12:34 PM  ?  ?  Failed - Cr in normal range and within 360 days  ?  Creat  ?Date Value Ref Range Status  ?07/30/2020 0.91 0.50 - 1.10 mg/dL Final  ?  ?  ?  ?  Failed - Valid encounter within last 12 months  ?  Recent Outpatient Visits   ? ?      ? 1 month ago Allergic urticaria  ? [facility] Care at [facility], Gildardo Pounds, NP  ? ?  ?  ? ?  ?  ?  ? ?

## 2021-12-03 ENCOUNTER — Telehealth: Payer: Self-pay | Admitting: Family Medicine

## 2021-12-03 NOTE — Telephone Encounter (Signed)
Pt checking in regarding Medication refill for hydrOXYzine (ATARAX) 10 MG tablet ?

## 2021-12-03 NOTE — Telephone Encounter (Signed)
Patient checking on the status of message below and would like a follow up call today. 

## 2021-12-06 MED ORDER — HYDROXYZINE HCL 10 MG PO TABS: 10.0000 mg | ORAL_TABLET | Freq: Three times a day (TID) | ORAL | 0 refills | Status: DC | PRN

## 2021-12-13 ENCOUNTER — Ambulatory Visit: Payer: Medicaid Other | Admitting: Family Medicine

## 2022-01-01 ENCOUNTER — Ambulatory Visit: Payer: Medicaid Other | Admitting: Family Medicine

## 2022-01-06 ENCOUNTER — Other Ambulatory Visit: Payer: Self-pay | Admitting: Family Medicine

## 2022-01-06 DIAGNOSIS — L500 Allergic urticaria: Secondary | ICD-10-CM

## 2022-03-18 HISTORY — PX: INDUCED ABORTION: SHX677

## 2022-03-31 NOTE — Progress Notes (Deleted)
Martin ID: Shelia Martin, female    DOB: [birth year]  MRN: [id number]  CC: Follow-Up  Subjective: Shelia Martin is a 31 y.o. female who presents for follow-up.   Her concerns today include:   Shelia Martin   Shelia Martin what f/u for?  Martin Active Problem List   Diagnosis Date Noted   Eczema 06/05/2013   Contraceptive management 05/04/2013     Current Outpatient Medications on File Prior to Visit  Medication Sig Dispense Refill   cetirizine (ZYRTEC) 10 MG chewable tablet Chew 10 mg by mouth daily.     hydrOXYzine (ATARAX) 10 MG tablet TAKE 1 TABLET BY MOUTH THREE TIMES A DAY AS NEEDED 30 tablet 0   medroxyPROGESTERone Acetate (DEPO-PROVERA) 150 MG/ML SUSY Inject 1 mL (150 mg total) into the muscle every 3 (three) months for 1 dose. 1 mL 3   triamcinolone (KENALOG) 0.1 % Apply 1 application topically 2 (two) times daily. 30 g 3   No current facility-administered medications on file prior to visit.    No Known Allergies  Social History   Socioeconomic History   Marital status: Single    Spouse name: Not on file   Number of children: Not on file   Years of education: Not on file   Highest education level: Not on file  Occupational History   Not on file  Tobacco Use   Smoking status: Never   Smokeless tobacco: Never  Substance and Sexual Activity   Alcohol use: No   Drug use: No   Sexual activity: Yes    Partners: Male  Other Topics Concern   Not on file  Social History Narrative   Not on file   Social Determinants of Health   Financial Resource Strain: Not on file  Food Insecurity: Not on file  Transportation Needs: Not on file  Physical Activity: Not on file  Stress: Not on file  Social Connections: Not on file  Intimate Partner Violence: Not on file    Family History  Problem Relation Age of Onset   Huntington's disease Mother    Diabetes Mother    Diabetes Father    Huntington's disease Maternal Grandmother    Huntington's disease  Maternal Uncle     Past Surgical History:  Procedure Laterality Date   CESAREAN SECTION MULTI-GESTATIONAL N/A 10/31/2016   Procedure: CESAREAN SECTION MULTI-GESTATIONAL;  Surgeon: Shelia Aspen, DO;  Location: [facility];  Service: Obstetrics;  Laterality: N/A;   Dr Shelia Martin to assist   NO PAST SURGERIES      ROS: Review of Systems Negative except as stated above  PHYSICAL EXAM: There were no vitals taken for this visit.  Physical Exam  {female adult master:310786} {female adult master:310785}     Latest Ref Rng & Units 07/30/2020    3:32 PM 12/24/2015    9:15 AM  CMP  Glucose 65 - 99 mg/dL 65  96   BUN 7 - 25 mg/dL 12  5   Creatinine 7.61 - 1.10 mg/dL 6.07  3.71   Sodium 062 - 146 mmol/L 139  137   Potassium 3.5 - 5.3 mmol/L 3.7  4.1   Chloride 98 - 110 mmol/L 103  103   CO2 20 - 32 mmol/L 27  24   Calcium 8.6 - 10.2 mg/dL 9.5  9.0   Total Protein 6.1 - 8.1 g/dL 6.6    Total Bilirubin 0.2 - 1.2 mg/dL 0.4    AST 10 - 30 U/L 13  ALT 6 - 29 U/L 12     Lipid Panel     Component Value Date/Time   CHOL 142 07/30/2020 1532   TRIG 133 07/30/2020 1532   HDL 44 (L) 07/30/2020 1532   CHOLHDL 3.2 07/30/2020 1532   LDLCALC 77 07/30/2020 1532    CBC    Component Value Date/Time   WBC 6.4 07/30/2020 1532   RBC 4.65 07/30/2020 1532   HGB 13.6 07/30/2020 1532   HCT 39.7 07/30/2020 1532   PLT 232 07/30/2020 1532   MCV 85.4 07/30/2020 1532   MCH 29.2 07/30/2020 1532   MCHC 34.3 07/30/2020 1532   RDW 12.4 07/30/2020 1532   LYMPHSABS 2,150 07/30/2020 1532   MONOABS 395 05/09/2016 1633   EOSABS 288 07/30/2020 1532   BASOSABS 38 07/30/2020 1532    ASSESSMENT AND PLAN:  There are no diagnoses linked to this encounter.   Martin was given the opportunity to ask questions.  Martin verbalized understanding of the plan and was able to repeat key elements of the plan. Martin was given clear instructions to go to Emergency Department or return to medical center if  symptoms don't improve, worsen, or new problems develop.The Martin verbalized understanding.   No orders of the defined types were placed in this encounter.    Requested Prescriptions    No prescriptions requested or ordered in this encounter    No follow-ups on file.  Shelia Fendt, NP

## 2022-04-08 ENCOUNTER — Ambulatory Visit: Payer: Medicaid Other | Admitting: Family

## 2022-04-14 NOTE — Progress Notes (Signed)
Erroneous encounter-disregard

## 2022-04-22 ENCOUNTER — Encounter: Payer: Medicaid Other | Admitting: Family

## 2022-06-02 ENCOUNTER — Other Ambulatory Visit: Payer: Self-pay | Admitting: *Deleted

## 2022-06-02 ENCOUNTER — Ambulatory Visit: Payer: Self-pay | Admitting: *Deleted

## 2022-06-02 ENCOUNTER — Telehealth: Payer: Self-pay | Admitting: Family Medicine

## 2022-06-02 NOTE — Telephone Encounter (Signed)
Medication Refill - Medication: triamcinolone (KENALOG) 0.1 % for her eczema  Has the patient contacted their pharmacy? No.   Preferred Pharmacy (with phone number or street name):  [id number] [place], [place] Phone:  [phone]  Fax:  [phone]      Has the patient been seen for an appointment in the last year OR does the patient have an upcoming appointment? Yes.    Agent: Please be advised that RX refills may take up to 3 business days. We ask that you follow-up with your pharmacy.

## 2022-06-02 NOTE — Telephone Encounter (Signed)
  Chief Complaint: Localized hives Symptoms: Few areas on legs. States had flare ups during summer. Mild itching Frequency: Over weekend Pertinent Negatives: Patient denies swelling Disposition: [] ED /[] Urgent Care (no appt availability in office) / [] Appointment(In office/virtual)/ []  [facility]/ [x] Home Care/ [] Refused Recommended Disposition /[] [facility]/ []  Follow-up with PCP Additional Notes: Home care advise provided. Pt verbalizes understanding.Also requesting refill of Kenalog cream. Will route to [facility] . Reason for Disposition  Localized hives  Answer Assessment - Initial Assessment Questions 1. APPEARANCE: "What does the rash look like?"      Red raised areas 2. LOCATION: "Where is the rash located?"      Both legs 3. NUMBER: "How many hives are there?"      "Not many" 4. SIZE: "How big are the hives?" (inches, cm, compare to coins) "Do they all look the same or is there lots of variation in shape and size?"      Starts like little welts, spreads when scratching. 5. ONSET: "When did the hives begin?" (Hours or days ago)      Weekend 6. ITCHING: "Does it itch?" If Yes, ask: "How bad is the itch?"    - MILD: doesn't interfere with normal activities   - MODERATE-SEVERE: interferes with work, school, sleep, or other activities      Yes, "Not bad as usual, this summer." 7. RECURRENT PROBLEM: "Have you had hives before?" If Yes, ask: "When was the last time?" and "What happened that time?"      Yes, break out in heat 8. TRIGGERS: "Were you exposed to any new food, plant, cosmetic product or animal just before the hives began?"     No 9. OTHER SYMPTOMS: "Do you have any other symptoms?" (e.g., fever, tongue swelling, difficulty breathing, abdomen pain)     No  Protocols used: Hives-A-AH

## 2022-06-09 NOTE — Telephone Encounter (Signed)
Pt is calling back to follow up on the medication refill. Pt stated Dr.Wilson is her new PCP she no longer sees [place], Modena Nunnery, MD Pt is requesting a callback.   Please advise.

## 2022-06-09 NOTE — Telephone Encounter (Signed)
Pt returned Lisa's call / please advise asap

## 2022-06-09 NOTE — Telephone Encounter (Signed)
Patient called and advised due to she has not established with Dr. Redmond Pulling, she would not be able to refill this medication until her upcoming appointment in November. She says that's fine she will wait until then. Advised schedule with a [facility] provider through [facility] if needing the medication sooner to discuss symptoms, she verbalized understanding.

## 2022-06-09 NOTE — Telephone Encounter (Signed)
noted 

## 2022-06-09 NOTE — Telephone Encounter (Signed)
Patient called, mailbox is full, unable to leave a message. Will route to office for provider to review request.

## 2022-06-17 NOTE — Progress Notes (Signed)
Patient ID: Shelia Martin, female    DOB: 1990/11/12  MRN: [id number]  CC: Medication Refill and STD screening   Subjective: Shelia Martin is a 31 y.o. female who presents for medication refill and STD screening.   Her concerns today include:   06/02/2022 per triage RN note: Chief Complaint: Localized hives Symptoms: Few areas on legs. States had flare ups during summer. Mild itching Frequency: Over weekend Pertinent Negatives: Patient denies swelling Disposition: [] ED /[] Urgent Care (no appt availability in office) / [] Appointment(In office/virtual)/ []  [facility]/ [x] Home Care/ [] Refused Recommended Disposition /[] [facility]/ []  Follow-up with PCP Additional Notes: Home care advise provided. Pt verbalizes understanding.Also requesting refill of Kenalog cream. Will route to [facility] . Reason for Disposition  Localized hives  Answer Assessment - Initial Assessment Questions 1. APPEARANCE: "What does the rash look like?"      Red raised areas 2. LOCATION: "Where is the rash located?"      Both legs 3. NUMBER: "How many hives are there?"      "Not many" 4. SIZE: "How big are the hives?" (inches, cm, compare to coins) "Do they all look the same or is there lots of variation in shape and size?"      Starts like little welts, spreads when scratching. 5. ONSET: "When did the hives begin?" (Hours or days ago)      Weekend 6. ITCHING: "Does it itch?" If Yes, ask: "How bad is the itch?"    - MILD: doesn't interfere with normal activities   - MODERATE-SEVERE: interferes with work, school, sleep, or other activities      Yes, "Not bad as usual, this summer." 7. RECURRENT PROBLEM: "Have you had hives before?" If Yes, ask: "When was the last time?" and "What happened that time?"      Yes, break out in heat 8. TRIGGERS: "Were you exposed to any new food, plant, cosmetic product or animal just before the hives began?"     No 9. OTHER SYMPTOMS: "Do you have any  other symptoms?" (e.g., fever, tongue swelling, difficulty breathing, abdomen pain)     No  Today's visit 06/24/2022: Patient presents for medication refills of Triamcinolone cream and Hydroxyzine. Also, requesting Depo injection, STD/HIV screening. No further issues/concerns today.   Patient Active Problem List   Diagnosis Date Noted   Eczema 06/05/2013   Contraceptive management 05/04/2013     Current Outpatient Medications on File Prior to Visit  Medication Sig Dispense Refill   cetirizine (ZYRTEC) 10 MG chewable tablet Chew 10 mg by mouth daily.     No current facility-administered medications on file prior to visit.    No Known Allergies  Social History   Socioeconomic History   Marital status: Single    Spouse name: Not on file   Number of children: Not on file   Years of education: Not on file   Highest education level: Not on file  Occupational History   Not on file  Tobacco Use   Smoking status: Never    Passive exposure: Never   Smokeless tobacco: Never  Substance and Sexual Activity   Alcohol use: No   Drug use: No   Sexual activity: Yes    Partners: Male  Other Topics Concern   Not on file  Social History Narrative   Not on file   Social Determinants of Health   Financial Resource Strain: Not on file  Food Insecurity: Not on file  Transportation Needs: Not on  file  Physical Activity: Not on file  Stress: Not on file  Social Connections: Not on file  Intimate Partner Violence: Not on file    Family History  Problem Relation Age of Onset   Huntington's disease Mother    Diabetes Mother    Diabetes Father    Huntington's disease Maternal Grandmother    Huntington's disease Maternal Uncle     Past Surgical History:  Procedure Laterality Date   CESAREAN SECTION MULTI-GESTATIONAL N/A 10/31/2016   Procedure: CESAREAN SECTION MULTI-GESTATIONAL;  Surgeon: Philip Aspen, DO;  Location: [facility];  Service: Obstetrics;  Laterality: N/A;    Dr Mora Appl to assist   INDUCED ABORTION  03/2022   NO PAST SURGERIES      ROS: Review of Systems Negative except as stated above  PHYSICAL EXAM: BP 125/82 (BP Location: Left Arm, Patient Position: Sitting, Cuff Size: Normal)   Pulse 76   Temp 98.3 F (36.8 C)   Resp 16   Ht 5\' 4"  (1.626 m)   Wt 150 lb (68 kg)   SpO2 98%   BMI 25.75 kg/m    Physical Exam HENT:     Head: Normocephalic and atraumatic.  Eyes:     Extraocular Movements: Extraocular movements intact.     Conjunctiva/sclera: Conjunctivae normal.     Pupils: Pupils are equal, round, and reactive to light.  Cardiovascular:     Rate and Rhythm: Normal rate and regular rhythm.     Pulses: Normal pulses.     Heart sounds: Normal heart sounds.  Pulmonary:     Effort: Pulmonary effort is normal.     Breath sounds: Normal breath sounds.  Musculoskeletal:     Cervical back: Normal range of motion and neck supple.  Neurological:     General: No focal deficit present.     Mental Status: She is alert and oriented to person, place, and time.  Psychiatric:        Mood and Affect: Mood normal.        Behavior: Behavior normal.     Results for orders placed or performed in visit on 06/24/22  POCT URINALYSIS DIP (CLINITEK)  Result Value Ref Range   Color, UA yellow yellow   Clarity, UA clear clear   Glucose, UA negative negative mg/dL   Bilirubin, UA negative negative   Ketones, POC UA negative negative mg/dL   Spec Grav, UA 13/07/23 1.884 - 1.025   Blood, UA negative negative   pH, UA 6.0 5.0 - 8.0   POC PROTEIN,UA negative negative, trace   Urobilinogen, UA 0.2 0.2 or 1.0 E.U./dL   Nitrite, UA Negative Negative   Leukocytes, UA Negative Negative  POCT urine pregnancy  Result Value Ref Range   Preg Test, Ur Negative Negative     ASSESSMENT AND PLAN: 1. Allergic urticaria - Continue Triamcinolone cream and Hydroxyzine as prescribed.  - Follow-up with primary provider in 3 months or sooner if needed.  -  hydrOXYzine (ATARAX) 10 MG tablet; Take 1 tablet (10 mg total) by mouth 3 (three) times daily as needed.  Dispense: 30 tablet; Refill: 2 - triamcinolone cream (KENALOG) 0.1 %; Apply 1 Application topically 2 (two) times daily.  Dispense: 60 g; Refill: 2  2. Screening examination for STD (sexually transmitted disease) - No urinary tract infection.  - Cervicovaginal self-swab to screen for chlamydia, gonorrhea, trichomonas, bacterial vaginitis, and candida vaginitis. - Routine syphilis screening.  - POCT URINALYSIS DIP (CLINITEK) - Cervicovaginal ancillary only - RPR  3. Encounter for screening for HIV - Routine screening.  - HIV Antibody (routine testing w rflx)  4. Encounter for Depo-Provera contraception - Urine pregnancy negative.  - Medroxyprogesterone injection administered. - Return in 12 weeks or sooner if needed. - POCT urine pregnancy - medroxyPROGESTERone (DEPO-PROVERA) injection 150 mg   Patient was given the opportunity to ask questions.  Patient verbalized understanding of the plan and was able to repeat key elements of the plan. Patient was given clear instructions to go to Emergency Department or return to medical center if symptoms don't improve, worsen, or new problems develop.The patient verbalized understanding.   Orders Placed This Encounter  Procedures   HIV Antibody (routine testing w rflx)   RPR   POCT URINALYSIS DIP (CLINITEK)   POCT urine pregnancy     Requested Prescriptions   Signed Prescriptions Disp Refills   hydrOXYzine (ATARAX) 10 MG tablet 30 tablet 2    Sig: Take 1 tablet (10 mg total) by mouth 3 (three) times daily as needed.   triamcinolone cream (KENALOG) 0.1 % 60 g 2    Sig: Apply 1 Application topically 2 (two) times daily.    Return in about 3 months (around 09/24/2022) for Follow-Up or next available Georganna Skeans, MD.  Rema Fendt, NP

## 2022-06-24 ENCOUNTER — Other Ambulatory Visit (HOSPITAL_COMMUNITY)
Admission: RE | Admit: 2022-06-24 | Discharge: 2022-06-24 | Disposition: A | Discharge status: Home / Self Care | Payer: Self-pay | Source: Ambulatory Visit | Attending: Family | Admitting: Family

## 2022-06-24 ENCOUNTER — Encounter: Payer: Self-pay | Admitting: Family

## 2022-06-24 ENCOUNTER — Ambulatory Visit (INDEPENDENT_AMBULATORY_CARE_PROVIDER_SITE_OTHER): Payer: Self-pay | Admitting: Family

## 2022-06-24 VITALS — BP 125/82 | HR 76 | Temp 98.3°F | Resp 16 | Ht 64.0 in | Wt 150.0 lb

## 2022-06-24 DIAGNOSIS — L500 Allergic urticaria: Secondary | ICD-10-CM

## 2022-06-24 DIAGNOSIS — Z3042 Encounter for surveillance of injectable contraceptive: Secondary | ICD-10-CM

## 2022-06-24 DIAGNOSIS — Z3202 Encounter for pregnancy test, result negative: Secondary | ICD-10-CM

## 2022-06-24 DIAGNOSIS — Z114 Encounter for screening for human immunodeficiency virus [HIV]: Secondary | ICD-10-CM

## 2022-06-24 DIAGNOSIS — Z113 Encounter for screening for infections with a predominantly sexual mode of transmission: Secondary | ICD-10-CM

## 2022-06-24 LAB — POCT URINALYSIS DIP (CLINITEK)
Bilirubin, UA: NEGATIVE
Blood, UA: NEGATIVE
Glucose, UA: NEGATIVE mg/dL
Ketones, POC UA: NEGATIVE mg/dL
Leukocytes, UA: NEGATIVE
Nitrite, UA: NEGATIVE
POC PROTEIN,UA: NEGATIVE
Spec Grav, UA: 1.02 (ref 1.010–1.025)
Urobilinogen, UA: 0.2 E.U./dL
pH, UA: 6 (ref 5.0–8.0)

## 2022-06-24 LAB — POCT URINE PREGNANCY: Preg Test, Ur: NEGATIVE

## 2022-06-24 MED ORDER — MEDROXYPROGESTERONE ACETATE 150 MG/ML IM SUSP
150.0000 mg | Freq: Once | INTRAMUSCULAR | Status: AC
  Administered 2022-06-24: 150 mg via INTRAMUSCULAR

## 2022-06-24 MED ORDER — TRIAMCINOLONE ACETONIDE 0.1 % EX CREA: 1.0000 | TOPICAL_CREAM | Freq: Two times a day (BID) | CUTANEOUS | 2 refills | Status: DC

## 2022-06-24 MED ORDER — HYDROXYZINE HCL 10 MG PO TABS: 10.0000 mg | ORAL_TABLET | Freq: Three times a day (TID) | ORAL | 2 refills | Status: AC | PRN

## 2022-06-24 NOTE — Progress Notes (Signed)
Pt presents for medication refills -needs hydroxyzine refilled and triamcinolone cream  -desires STD screening along w/HIV

## 2022-06-24 NOTE — Patient Instructions (Signed)
Hives Hives are itchy, red, swollen areas on your skin. Hives can show up on any part of your body. Hives often fade within 24 hours (acute hives). New hives can show up after old ones fade. This can go on for many days or weeks (chronic hives). Hives do not spread from person to person (are not contagious). Hives are caused by your body's response to something that you are allergic to (allergen). These are sometimes called triggers. You can get hives right after being around a trigger, or hours later. What are the causes? Allergies to foods. Insect bites or stings. Exposure to pollen or pets. Spending time in sunlight, heat, or cold. Exercise. Stress. You can also get hives from other medical conditions and treatments, such as: Some medicines. Chemicals or latex. Viruses. This includes the common cold. Infections caused by germs (bacteria). Allergy shots. Blood transfusions. Sometimes, the cause is not known. What increases the risk? Being a woman. Being allergic to foods such as: Citrus fruits. Milk. Eggs. Peanuts. Tree nuts. Shellfish. Being allergic to: Medicines. Latex. Insects. Animals. Pollen. What are the signs or symptoms?  Raised, itchy, red or white bumps or patches on your skin. These areas may: Get large and swollen. Change in shape and location. Stand alone or connect to each other over a large area of skin. Sting or hurt. Turn white when pressed in the center (blanch). In very bad cases, your hands, feet, and face may also get swollen. This may happen if hives start deeper in your skin. How is this treated? Treatment for this condition depends on your symptoms. Treatment may include: Using cool, wet cloths (cool compresses) or taking cool showers to stop the itching. Medicines that help: Relieve itching (antihistamines). Reduce swelling (corticosteroids). Treat infection (antibiotics). A medicine (omalizumab) that is given as a shot (injection). Your  doctor may prescribe this if you have hives that do not get better even after other treatments. In very bad cases, you may need a shot of a medicine called epinephrine to prevent a life-threatening allergic reaction (anaphylaxis). Follow these instructions at home: Medicines Take or apply over-the-counter and prescription medicines only as told by your doctor. If you were prescribed an antibiotic medicine, use it as told by your doctor. Do not stop using it even if you start to feel better. Skin care Apply cool, wet cloths to the hives. Do not scratch your skin. Do not rub your skin. General instructions Do not take hot showers or baths. This can make itching worse. Do not wear tight clothes. Use sunscreen and wear clothes that cover your skin when you are outside. Avoid any triggers that cause your hives. Keep a journal to help track what causes your hives. Write down: What medicines you take. What you eat and drink. What products you use on your skin. Keep all follow-up visits as told by your doctor. This is important. Contact a doctor if: Your symptoms are not better with medicine. Your joints hurt or are swollen. Get help right away if: You have a fever. You have pain in your belly (abdomen). Your tongue or lips are swollen. Your eyelids are swollen. Your chest or throat feels tight. You have trouble breathing or swallowing. These symptoms may be an emergency. Do not wait to see if the symptoms will go away. Get medical help right away. Call your local emergency services (911 in the [place] Do not drive yourself to the hospital. Summary Hives are itchy, red, swollen areas on your skin. Treatment   for this condition depends on your symptoms. Avoid things that cause your hives. Keep a journal to help track what causes your hives. Take and apply over-the-counter and prescription medicines only as told by your doctor. Get help right away if your chest or throat feels tight or if you  have trouble breathing or swallowing. This information is not intended to replace advice given to you by your health care provider. Make sure you discuss any questions you have with your health care provider. Document Revised: 09/21/2020 Document Reviewed: 09/23/2020 Elsevier Patient Education  2023 Elsevier Inc.  

## 2022-06-25 LAB — RPR: RPR Ser Ql: NONREACTIVE

## 2022-06-25 LAB — HIV ANTIBODY (ROUTINE TESTING W REFLEX): HIV Screen 4th Generation wRfx: NONREACTIVE

## 2022-06-26 ENCOUNTER — Other Ambulatory Visit: Payer: Self-pay | Admitting: Family

## 2022-06-26 DIAGNOSIS — N760 Acute vaginitis: Secondary | ICD-10-CM | POA: Insufficient documentation

## 2022-06-26 LAB — CERVICOVAGINAL ANCILLARY ONLY
Bacterial Vaginitis (gardnerella): POSITIVE — AB
Candida Glabrata: NEGATIVE
Candida Vaginitis: NEGATIVE
Chlamydia: NEGATIVE
Comment: NEGATIVE
Comment: NEGATIVE
Comment: NEGATIVE
Comment: NEGATIVE
Comment: NEGATIVE
Comment: NORMAL
Neisseria Gonorrhea: NEGATIVE
Trichomonas: NEGATIVE

## 2022-06-26 MED ORDER — METRONIDAZOLE 500 MG PO TABS: 500.0000 mg | ORAL_TABLET | Freq: Two times a day (BID) | ORAL | 0 refills | Status: AC

## 2022-10-02 ENCOUNTER — Other Ambulatory Visit: Payer: Self-pay | Admitting: Family Medicine

## 2022-10-02 DIAGNOSIS — L500 Allergic urticaria: Secondary | ICD-10-CM

## 2022-10-02 NOTE — Telephone Encounter (Signed)
Medication Refill - Medication: triamcinolone cream (KENALOG) 0.1 %   Has the patient contacted their pharmacy? Yes.   No, more refills.   (Agent: If yes, when and what did the pharmacy advise?)  Preferred Pharmacy (with phone number or street name):  [id number] [place] [place] [id number] Phone: [phone]: [phone] Hours: Not open 24 hours   Has the patient been seen for an appointment in the last year OR does the patient have an upcoming appointment? No.  Agent: Please be advised that RX refills may take up to 3 business days. We ask that you follow-up with your pharmacy.

## 2022-10-02 NOTE — Telephone Encounter (Signed)
Requested medication (s) are due for refill today:   Provider to review  Requested medication (s) are on the active medication list:   Yes  Future visit scheduled:   No   Last ordered: 06/24/2022 60 g, 2 refills   Non delegated refill reason returned  Requested Prescriptions  Pending Prescriptions Disp Refills   triamcinolone cream (KENALOG) 0.1 % 60 g 2    Sig: Apply 1 Application topically 2 (two) times daily.     Not Delegated - Dermatology:  Corticosteroids Failed - 10/02/2022  3:45 PM      Failed - This refill cannot be delegated      Passed - Valid encounter within last 12 months    Recent Outpatient Visits           3 months ago Allergic urticaria   [facility] [facility], Dam[place], NP   11 months ago Allergic urticaria   [place] [facility], Kriste Basque, NP   1 year ago Encounter for surveillance of injectable contraceptive   [facility] Eulogio Bear, NP   1 year ago Screening for STD (sexually transmitted disease)   [place] Eulogio Bear, NP   2 years ago Encounter for assessment of STD exposure   [place] [facility], Cammie Mcgee, MD

## 2022-10-08 MED ORDER — TRIAMCINOLONE ACETONIDE 0.1 % EX CREA: 1.0000 | TOPICAL_CREAM | Freq: Two times a day (BID) | CUTANEOUS | 2 refills | Status: DC

## 2022-10-08 NOTE — Telephone Encounter (Signed)
Order complete. 

## 2022-10-23 ENCOUNTER — Ambulatory Visit: Payer: Self-pay

## 2022-10-23 ENCOUNTER — Other Ambulatory Visit: Payer: Self-pay | Admitting: Family Medicine

## 2022-10-23 DIAGNOSIS — L500 Allergic urticaria: Secondary | ICD-10-CM

## 2022-10-23 NOTE — Telephone Encounter (Signed)
Patient given appt

## 2022-10-23 NOTE — Telephone Encounter (Signed)
Requested medication (s) are due for refill today:   Yes  Requested medication (s) are on the active medication list:   Yes  Future visit scheduled:   Yes 11/03/2022   Last ordered: 06/24/2022 #30, 2 refills  Returned because a DX Code is being requested and per protocol a Cr. Is due.   Requested Prescriptions  Pending Prescriptions Disp Refills   hydrOXYzine (ATARAX) 10 MG tablet 30 tablet 2    Sig: Take 1 tablet (10 mg total) by mouth 3 (three) times daily as needed.     Ear, Nose, and Throat:  Antihistamines 2 Failed - 10/23/2022 10:04 AM      Failed - Cr in normal range and within 360 days    Creat  Date Value Ref Range Status  07/30/2020 0.91 0.50 - 1.10 mg/dL Final         Passed - Valid encounter within last 12 months    Recent Outpatient Visits           4 months ago Allergic urticaria   [place] [facility] at [facility], [place], NP   12 months ago Allergic urticaria   [place] [facility], Kriste Basque, NP   1 year ago Encounter for surveillance of injectable contraceptive   [facility] Eulogio Bear, NP   1 year ago Screening for STD (sexually transmitted disease)   [place] Eulogio Bear, NP   2 years ago Encounter for assessment of STD exposure   [place] Pick[facility], Cammie Mcgee, MD       Future Appointments             In 1 week Dorna Mai, MD [place] at [facility]   In 1 month Dorna Mai, MD [facility] at [facility]

## 2022-10-23 NOTE — Telephone Encounter (Signed)
Summary: Vaginal irritation   The patient called in stating she has had some vaginal irritation for several days now. She says she has changed soaps and believes that may have caused this. Her provider does not have any available appts until March 12th. Please assist patient further            Chief Complaint: Pt. Took a bath using a different soap and now has vaginal irritation.Asking for Metronidazole to be sent to pharmacy. Symptoms: Above Frequency: 2 days ago Pertinent Negatives: Patient denies itching or pain Disposition: '[]'$ ED /'[]'$ Urgent Care (no appt availability in office) / '[]'$ Appointment(In office/virtual)/ '[]'$  [facility]/ '[]'$ Home Care/ '[]'$ Refused Recommended Disposition /'[]'$ [place] [facility]/ '[x]'$  Follow-up with PCP Additional Notes: Please advise pt.  Answer Assessment - Initial Assessment Questions 1. SYMPTOM: "What's the main symptom you're concerned about?" (e.g., pain, itching, dryness)     Feels irritated 2. LOCATION: "Where is the   located?" (e.g., inside/outside, left/right)     Outside 3. ONSET: "When did the    start?"     2 days ago 4. PAIN: "Is there any pain?" If Yes, ask: "How bad is it?" (Scale: 1-10; mild, moderate, severe)   -  MILD (1-3): Doesn't interfere with normal activities.    -  MODERATE (4-7): Interferes with normal activities (e.g., work or school) or awakens from sleep.     -  SEVERE (8-10): Excruciating pain, unable to do any normal activities.     No 5. ITCHING: "Is there any itching?" If Yes, ask: "How bad is it?" (Scale: 1-10; mild, moderate, severe)     N 6. CAUSE: "What do you think is causing the discharge?" "Have you had the same problem before? What happened then?"     Used a different soap 7. OTHER SYMPTOMS: "Do you have any other symptoms?" (e.g., fever, itching, vaginal bleeding, pain with urination, injury to genital area, vaginal foreign body)     No 8. PREGNANCY: "Is there any chance you are pregnant?" "When was your  last menstrual period?"     No  Protocols used: Vaginal Symptoms-A-AH

## 2022-10-23 NOTE — Telephone Encounter (Signed)
Medication Refill - Medication: hydrOXYzine (ATARAX) 10 MG tablet   Has the patient contacted their pharmacy? No.   Preferred Pharmacy (with phone number or street name[facility]  [id number] [place] [place]: [phone] Fax: [phone]    Has the patient been seen for an appointment in the last year OR does the patient have an upcoming appointment? Yes.    The patient states she gets a rash in the spring with weather change and that is why she is requesting a refill on this medication now. She is hoping to act early on this to better prepare herself for this upcoming spring. Please assist patient further

## 2022-11-03 ENCOUNTER — Ambulatory Visit: Payer: Self-pay | Admitting: Family Medicine

## 2022-11-17 ENCOUNTER — Other Ambulatory Visit: Payer: Self-pay | Admitting: Family Medicine

## 2022-11-17 DIAGNOSIS — L500 Allergic urticaria: Secondary | ICD-10-CM

## 2022-11-17 NOTE — Telephone Encounter (Signed)
Medication Refill - Medication: triamcinolone cream (KENALOG) 0.1 % [id number]    Has the patient contacted their pharmacy? No. (Agent: If no, request that the patient contact the pharmacy for the refill. If patient does not wish to contact the pharmacy document the reason why and proceed with request.) (Agent: If yes, when and what did the pharmacy advise?)  Preferred Pharmacy (with phone number or street name[facility]  [id number] [place] [place] Phone: [phone]  Fax: [phone]     Has the patient been seen for an appointment in the last year OR does the patient have an upcoming appointment? Yes.    Agent: Please be advised that RX refills may take up to 3 business days. We ask that you follow-up with your pharmacy.

## 2022-11-18 NOTE — Telephone Encounter (Signed)
Requested medication (s) are due for refill today: No  Requested medication (s) are on the active medication list: yes    Last refill: 10/08/22  60g  2 refills  Future visit scheduled yes  12/09/22  Notes to clinic:Not delegated, please review. Thank you.  Requested Prescriptions  Pending Prescriptions Disp Refills   triamcinolone cream (KENALOG) 0.1 % 60 g 2    Sig: Apply 1 Application topically 2 (two) times daily.     Not Delegated - Dermatology:  Corticosteroids Failed - 11/17/2022 12:55 PM      Failed - This refill cannot be delegated      Passed - Valid encounter within last 12 months    Recent Outpatient Visits           4 months ago Allergic urticaria   [place] [facility] at [facility], [place], NP   1 year ago Allergic urticaria   [place] [facility] at [facility], Kriste Basque, NP   1 year ago Encounter for surveillance of injectable contraceptive   [facility] Eulogio Bear, NP   1 year ago Screening for STD (sexually transmitted disease)   [place] Eulogio Bear, NP   2 years ago Encounter for assessment of STD exposure   [place] [facility], Cammie Mcgee, MD       Future Appointments             In 3 weeks Dorna Mai, MD [place] at [facility]   In 1 month Dorna Mai, MD [place] at [facility]

## 2022-12-09 ENCOUNTER — Ambulatory Visit: Payer: Self-pay | Admitting: Family Medicine

## 2022-12-18 ENCOUNTER — Encounter: Payer: Self-pay | Admitting: Family Medicine

## 2023-01-05 ENCOUNTER — Encounter: Payer: Self-pay | Admitting: Family Medicine

## 2023-06-04 ENCOUNTER — Encounter: Payer: Self-pay | Admitting: Family Medicine

## 2023-07-27 ENCOUNTER — Ambulatory Visit (INDEPENDENT_AMBULATORY_CARE_PROVIDER_SITE_OTHER): Payer: Medicaid Other | Admitting: Family Medicine

## 2023-07-27 ENCOUNTER — Encounter: Payer: Self-pay | Admitting: Family Medicine

## 2023-07-27 VITALS — BP 117/77 | HR 70 | Temp 98.1°F | Resp 16 | Wt 182.0 lb

## 2023-07-27 DIAGNOSIS — Z0000 Encounter for general adult medical examination without abnormal findings: Secondary | ICD-10-CM | POA: Diagnosis not present

## 2023-07-27 DIAGNOSIS — Z7689 Persons encountering health services in other specified circumstances: Secondary | ICD-10-CM

## 2023-07-27 DIAGNOSIS — Z13220 Encounter for screening for lipoid disorders: Secondary | ICD-10-CM | POA: Diagnosis not present

## 2023-07-27 DIAGNOSIS — O30049 Twin pregnancy, dichorionic/diamniotic, unspecified trimester: Secondary | ICD-10-CM | POA: Insufficient documentation

## 2023-07-27 DIAGNOSIS — Z130 Encounter for screening for diseases of the blood and blood-forming organs and certain disorders involving the immune mechanism: Secondary | ICD-10-CM | POA: Diagnosis not present

## 2023-07-27 NOTE — Progress Notes (Unsigned)
-  Patient is here to have annually  complete physical examination  -[facility] address -labs taken  

## 2023-07-28 ENCOUNTER — Ambulatory Visit: Payer: Self-pay

## 2023-07-28 LAB — CMP14+EGFR
ALT: 12 [IU]/L (ref 0–32)
AST: 14 [IU]/L (ref 0–40)
Albumin: 4.4 g/dL (ref 3.9–4.9)
Alkaline Phosphatase: 64 [IU]/L (ref 44–121)
BUN/Creatinine Ratio: 8 — ABNORMAL LOW (ref 9–23)
BUN: 6 mg/dL (ref 6–20)
Bilirubin Total: 0.4 mg/dL (ref 0.0–1.2)
CO2: 22 mmol/L (ref 20–29)
Calcium: 9.6 mg/dL (ref 8.7–10.2)
Chloride: 102 mmol/L (ref 96–106)
Creatinine, Ser: 0.78 mg/dL (ref 0.57–1.00)
Globulin, Total: 2.8 g/dL (ref 1.5–4.5)
Glucose: 78 mg/dL (ref 70–99)
Potassium: 4.2 mmol/L (ref 3.5–5.2)
Sodium: 139 mmol/L (ref 134–144)
Total Protein: 7.2 g/dL (ref 6.0–8.5)
eGFR: 104 mL/min/{1.73_m2} (ref >59)

## 2023-07-28 LAB — CBC WITH DIFFERENTIAL/PLATELET
Basophils Absolute: 0 10*3/uL (ref 0.0–0.2)
Basos: 1 %
EOS (ABSOLUTE): 0.2 10*3/uL (ref 0.0–0.4)
Eos: 3 %
Hematocrit: 42.3 % (ref 34.0–46.6)
Hemoglobin: 13.7 g/dL (ref 11.1–15.9)
Immature Grans (Abs): 0 10*3/uL (ref 0.0–0.1)
Immature Granulocytes: 0 %
Lymphocytes Absolute: 1.9 10*3/uL (ref 0.7–3.1)
Lymphs: 30 %
MCH: 28.3 pg (ref 26.6–33.0)
MCHC: 32.4 g/dL (ref 31.5–35.7)
MCV: 87 fL (ref 79–97)
Monocytes Absolute: 0.3 10*3/uL (ref 0.1–0.9)
Monocytes: 5 %
Neutrophils Absolute: 3.9 10*3/uL (ref 1.4–7.0)
Neutrophils: 61 %
Platelets: 249 10*3/uL (ref 150–450)
RBC: 4.84 x10E6/uL (ref 3.77–5.28)
RDW: 12.6 % (ref 11.7–15.4)
WBC: 6.3 10*3/uL (ref 3.4–10.8)

## 2023-07-28 LAB — LIPID PANEL
Chol/HDL Ratio: 4 {ratio} (ref 0.0–4.4)
Cholesterol, Total: 160 mg/dL (ref 100–199)
HDL: 40 mg/dL (ref >39)
LDL Chol Calc (NIH): 100 mg/dL — ABNORMAL HIGH (ref 0–99)
Triglycerides: 110 mg/dL (ref 0–149)
VLDL Cholesterol Cal: 20 mg/dL (ref 5–40)

## 2023-07-28 NOTE — Telephone Encounter (Signed)
Called pt and pt stated she will call to make appt when she's ready to schedule since she does not live near. Recommended the mobile unit for a walk in appt

## 2023-07-28 NOTE — Telephone Encounter (Signed)
Pt called for lab results. Shared provider's note.    Results are normal/clinically unremarkable - no further management indicated at this time  Written by Georganna Skeans, MD on 07/28/2023  7:56 AM EST Seen by patient Shelia Martin on 07/28/2023  2:30 PM  Pt also wanted to STI testing done. Will pt need to come back to the office for this testing?  Please advise.

## 2023-07-28 NOTE — Progress Notes (Signed)
Established Patient Office Visit  Subjective    Patient ID: Shelia Martin, female    DOB: Mar 17, 1991  Age: 32 y.o. MRN: [id number]  CC:  Chief Complaint  Patient presents with   Annual Exam    HPI Shelia Martin presents for routine annual exam. Patient denies acute complaints or concerns.   Outpatient Encounter Medications as of 07/27/2023  Medication Sig   cetirizine (ZYRTEC) 10 MG chewable tablet Chew 10 mg by mouth daily. (Patient not taking: Reported on 07/27/2023)   hydrOXYzine (ATARAX) 10 MG tablet Take 1 tablet (10 mg total) by mouth 3 (three) times daily as needed. (Patient not taking: Reported on 07/27/2023)   triamcinolone cream (KENALOG) 0.1 % Apply 1 Application topically 2 (two) times daily. (Patient not taking: Reported on 07/27/2023)   No facility-administered encounter medications on file as of 07/27/2023.    Past Medical History:  Diagnosis Date   Eczema     Past Surgical History:  Procedure Laterality Date   CESAREAN SECTION MULTI-GESTATIONAL N/A 10/31/2016   Procedure: CESAREAN SECTION MULTI-GESTATIONAL;  Surgeon: Philip Aspen, DO;  Location: [facility];  Service: Obstetrics;  Laterality: N/A;   Dr Mora Appl to assist   INDUCED ABORTION  03/2022   NO PAST SURGERIES      Family History  Problem Relation Age of Onset   Huntington's disease Mother    Diabetes Mother    Diabetes Father    Huntington's disease Maternal Grandmother    Huntington's disease Maternal Uncle     Social History   Socioeconomic History   Marital status: Single    Spouse name: Not on file   Number of children: Not on file   Years of education: Not on file   Highest education level: Not on file  Occupational History   Not on file  Tobacco Use   Smoking status: Never    Passive exposure: Never   Smokeless tobacco: Never  Substance and Sexual Activity   Alcohol use: No   Drug use: No   Sexual activity: Yes    Partners: Male  Other Topics Concern   Not on  file  Social History Narrative   Not on file   Social Determinants of Health   Financial Resource Strain: Medium Risk (07/27/2023)   Overall Financial Resource Strain (CARDIA)    Difficulty of Paying Living Expenses: Somewhat hard  Food Insecurity: No Food Insecurity (07/27/2023)   Hunger Vital Sign    Worried About Running Out of Food in the Last Year: Never true    Ran Out of Food in the Last Year: Never true  Transportation Needs: No Transportation Needs (07/27/2023)   PRAPARE - Administrator, Civil Service (Medical): No    Lack of Transportation (Non-Medical): No  Physical Activity: Insufficiently Active (07/27/2023)   Exercise Vital Sign    Days of Exercise per Week: 3 days    Minutes of Exercise per Session: 30 min  Stress: No Stress Concern Present (07/27/2023)   [employer] of Occupational Health - Occupational Stress Questionnaire    Feeling of Stress : Not at all  Social Connections: Unknown (07/27/2023)   Social Connection and Isolation Panel [NHANES]    Frequency of Communication with Friends and Family: Three times a week    Frequency of Social Gatherings with Friends and Family: Three times a week    Attends Religious Services: Not on file    Active Member of Clubs or Organizations: No    Attends  Club or Organization Meetings: 1 to 4 times per year    Marital Status: Never married  Intimate Partner Violence: Not At Risk (07/27/2023)   Humiliation, Afraid, Rape, and Kick questionnaire    Fear of Current or Ex-Partner: No    Emotionally Abused: No    Physically Abused: No    Sexually Abused: No    Review of Systems  All other systems reviewed and are negative.       Objective    BP 117/77   Pulse 70   Temp 98.1 F (36.7 C) (Oral)   Resp 16   Wt 182 lb (82.6 kg)   SpO2 96%   BMI 31.24 kg/m   Physical Exam Vitals and nursing note reviewed.  Constitutional:      General: She is not in acute distress. HENT:     Head: Normocephalic and  atraumatic.     Right Ear: Tympanic membrane, ear canal and external ear normal.     Left Ear: Tympanic membrane, ear canal and external ear normal.     Nose: Nose normal.     Mouth/Throat:     Mouth: Mucous membranes are moist.     Pharynx: Oropharynx is clear.  Eyes:     Conjunctiva/sclera: Conjunctivae normal.     Pupils: Pupils are equal, round, and reactive to light.  Neck:     Thyroid: No thyromegaly.  Cardiovascular:     Rate and Rhythm: Normal rate and regular rhythm.     Heart sounds: Normal heart sounds. No murmur heard. Pulmonary:     Effort: Pulmonary effort is normal. No respiratory distress.     Breath sounds: Normal breath sounds.  Abdominal:     General: There is no distension.     Palpations: Abdomen is soft. There is no mass.     Tenderness: There is no abdominal tenderness.  Musculoskeletal:        General: Normal range of motion.     Cervical back: Normal range of motion and neck supple.  Skin:    General: Skin is warm and dry.  Neurological:     General: No focal deficit present.     Mental Status: She is alert and oriented to person, place, and time.  Psychiatric:        Mood and Affect: Mood normal.        Behavior: Behavior normal.         Assessment & Plan:   Annual physical exam -     CMP14+EGFR  Screening for deficiency anemia -     CBC with Differential/Platelet  Screening for lipid disorders -     Lipid panel  Encounter to establish care     No follow-ups on file.   Tommie Raymond, MD

## 2023-11-15 ENCOUNTER — Other Ambulatory Visit: Payer: Self-pay | Admitting: Family

## 2023-11-15 DIAGNOSIS — L500 Allergic urticaria: Secondary | ICD-10-CM

## 2023-11-16 ENCOUNTER — Other Ambulatory Visit: Payer: Self-pay

## 2023-11-16 DIAGNOSIS — L500 Allergic urticaria: Secondary | ICD-10-CM

## 2023-11-16 MED ORDER — TRIAMCINOLONE ACETONIDE 0.1 % EX CREA: 1.0000 | TOPICAL_CREAM | Freq: Two times a day (BID) | CUTANEOUS | 2 refills | Status: DC

## 2024-04-08 ENCOUNTER — Other Ambulatory Visit: Payer: Self-pay | Admitting: Family

## 2024-04-08 DIAGNOSIS — L500 Allergic urticaria: Secondary | ICD-10-CM

## 2024-04-08 NOTE — Telephone Encounter (Signed)
 The patient called in stating she is completely out but understands the 48-72 business hour turn around time. She asked if anything can be done to expedite it she truly appreciates it. Please assist patient further.

## 2024-04-11 ENCOUNTER — Ambulatory Visit: Admitting: Family Medicine

## 2024-05-18 ENCOUNTER — Encounter: Payer: Self-pay | Admitting: Family Medicine

## 2024-05-18 ENCOUNTER — Ambulatory Visit (INDEPENDENT_AMBULATORY_CARE_PROVIDER_SITE_OTHER): Admitting: Family Medicine

## 2024-05-18 ENCOUNTER — Other Ambulatory Visit (HOSPITAL_COMMUNITY)
Admission: RE | Admit: 2024-05-18 | Discharge: 2024-05-18 | Disposition: A | Discharge status: Home / Self Care | Source: Ambulatory Visit | Attending: Family Medicine | Admitting: Family Medicine

## 2024-05-18 VITALS — BP 131/75 | HR 68 | Ht 64.0 in | Wt 186.8 lb

## 2024-05-18 DIAGNOSIS — Z136 Encounter for screening for cardiovascular disorders: Secondary | ICD-10-CM

## 2024-05-18 DIAGNOSIS — Z113 Encounter for screening for infections with a predominantly sexual mode of transmission: Secondary | ICD-10-CM | POA: Insufficient documentation

## 2024-05-18 DIAGNOSIS — Z130 Encounter for screening for diseases of the blood and blood-forming organs and certain disorders involving the immune mechanism: Secondary | ICD-10-CM | POA: Diagnosis not present

## 2024-05-18 DIAGNOSIS — Z0000 Encounter for general adult medical examination without abnormal findings: Secondary | ICD-10-CM

## 2024-05-18 DIAGNOSIS — Z1329 Encounter for screening for other suspected endocrine disorder: Secondary | ICD-10-CM

## 2024-05-18 DIAGNOSIS — Z13228 Encounter for screening for other metabolic disorders: Secondary | ICD-10-CM

## 2024-05-18 NOTE — Progress Notes (Signed)
 Established Patient Office Visit  Subjective    Patient ID: Shelia Martin, female    DOB: 05-19-1991  Age: 33 y.o. MRN: [id number]  CC:  Chief Complaint  Patient presents with   std testing     HPI Shelia Martin presents for routine annual exam. Patient denies acute complaints.   Outpatient Encounter Medications as of 05/18/2024  Medication Sig   cetirizine (ZYRTEC) 10 MG chewable tablet Chew 10 mg by mouth daily. (Patient not taking: Reported on 07/27/2023)   hydrOXYzine  (ATARAX ) 10 MG tablet Take 1 tablet (10 mg total) by mouth 3 (three) times daily as needed. (Patient not taking: Reported on 07/27/2023)   triamcinolone  cream (KENALOG ) 0.1 % APPLY TO AFFECTED AREA TWICE A DAY   triamcinolone  cream (KENALOG ) 0.1 % APPLY TO AFFECTED AREA TWICE A DAY   No facility-administered encounter medications on file as of 05/18/2024.    Past Medical History:  Diagnosis Date   Eczema     Past Surgical History:  Procedure Laterality Date   CESAREAN SECTION MULTI-GESTATIONAL N/A 10/31/2016   Procedure: CESAREAN SECTION MULTI-GESTATIONAL;  Surgeon: Donna Just, DO;  Location: [facility];  Service: Obstetrics;  Laterality: N/A;   Dr Bettina to assist   INDUCED ABORTION  03/2022   NO PAST SURGERIES      Family History  Problem Relation Age of Onset   Huntington's disease Mother    Diabetes Mother    Diabetes Father    Huntington's disease Maternal Grandmother    Huntington's disease Maternal Uncle     Social History   Socioeconomic History   Marital status: Single    Spouse name: Not on file   Number of children: Not on file   Years of education: Not on file   Highest education level: Not on file  Occupational History   Not on file  Tobacco Use   Smoking status: Never    Passive exposure: Never   Smokeless tobacco: Never  Substance and Sexual Activity   Alcohol use: No   Drug use: No   Sexual activity: Yes    Partners: Male  Other Topics Concern   Not  on file  Social History Narrative   Not on file   Social Drivers of Health   Financial Resource Strain: Medium Risk (07/27/2023)   Overall Financial Resource Strain (CARDIA)    Difficulty of Paying Living Expenses: Somewhat hard  Food Insecurity: No Food Insecurity (07/27/2023)   Hunger Vital Sign    Worried About Running Out of Food in the Last Year: Never true    Ran Out of Food in the Last Year: Never true  Transportation Needs: No Transportation Needs (07/27/2023)   PRAPARE - Administrator, Civil Service (Medical): No    Lack of Transportation (Non-Medical): No  Physical Activity: Insufficiently Active (07/27/2023)   Exercise Vital Sign    Days of Exercise per Week: 3 days    Minutes of Exercise per Session: 30 min  Stress: No Stress Concern Present (07/27/2023)   [employer] of Occupational Health - Occupational Stress Questionnaire    Feeling of Stress : Not at all  Social Connections: Unknown (07/27/2023)   Social Connection and Isolation Panel    Frequency of Communication with Friends and Family: Three times a week    Frequency of Social Gatherings with Friends and Family: Three times a week    Attends Religious Services: Not on file    Active Member of Clubs or Organizations:  No    Attends Club or Organization Meetings: 1 to 4 times per year    Marital Status: Never married  Intimate Partner Violence: Not At Risk (07/27/2023)   Humiliation, Afraid, Rape, and Kick questionnaire    Fear of Current or Ex-Partner: No    Emotionally Abused: No    Physically Abused: No    Sexually Abused: No    Review of Systems  All other systems reviewed and are negative.       Objective    BP 131/75   Pulse 68   Ht 5' 4 (1.626 m)   Wt 186 lb 12.8 oz (84.7 kg)   LMP 05/18/2024   SpO2 98%   BMI 32.06 kg/m   Physical Exam Vitals and nursing note reviewed.  Constitutional:      General: She is not in acute distress. HENT:     Head: Normocephalic and  atraumatic.     Right Ear: Tympanic membrane, ear canal and external ear normal.     Left Ear: Tympanic membrane, ear canal and external ear normal.     Nose: Nose normal.     Mouth/Throat:     Mouth: Mucous membranes are moist.     Pharynx: Oropharynx is clear.  Eyes:     Conjunctiva/sclera: Conjunctivae normal.     Pupils: Pupils are equal, round, and reactive to light.  Neck:     Thyroid: No thyromegaly.  Cardiovascular:     Rate and Rhythm: Normal rate and regular rhythm.     Heart sounds: Normal heart sounds. No murmur heard. Pulmonary:     Effort: Pulmonary effort is normal. No respiratory distress.     Breath sounds: Normal breath sounds.  Abdominal:     General: There is no distension.     Palpations: Abdomen is soft. There is no mass.     Tenderness: There is no abdominal tenderness.  Musculoskeletal:        General: Normal range of motion.     Cervical back: Normal range of motion and neck supple.  Skin:    General: Skin is warm and dry.  Neurological:     General: No focal deficit present.     Mental Status: She is alert and oriented to person, place, and time.  Psychiatric:        Mood and Affect: Mood normal.        Behavior: Behavior normal.         Assessment & Plan:   Annual physical exam -     CMP14+EGFR  Screen for STD (sexually transmitted disease) -     Cervicovaginal ancillary only  Screening for deficiency anemia -     CBC with Differential/Platelet  Encounter for screening for cardiovascular disorders -     Lipid panel  Screening for endocrine/metabolic/immunity disorders -     Hemoglobin A1c     No follow-ups on file.   Tanda Raguel SQUIBB, MD

## 2024-05-19 ENCOUNTER — Ambulatory Visit: Payer: Self-pay | Admitting: Family Medicine

## 2024-05-19 LAB — CERVICOVAGINAL ANCILLARY ONLY
Bacterial Vaginitis (gardnerella): POSITIVE — AB
Candida Glabrata: NEGATIVE
Candida Vaginitis: NEGATIVE
Chlamydia: NEGATIVE
Comment: NEGATIVE
Comment: NEGATIVE
Comment: NEGATIVE
Comment: NEGATIVE
Comment: NEGATIVE
Comment: NORMAL
Neisseria Gonorrhea: NEGATIVE
Trichomonas: NEGATIVE

## 2024-05-19 LAB — CBC WITH DIFFERENTIAL/PLATELET
Basophils Absolute: 0 x10E3/uL (ref 0.0–0.2)
Basos: 1 %
EOS (ABSOLUTE): 0.1 x10E3/uL (ref 0.0–0.4)
Eos: 2 %
Hematocrit: 40.6 % (ref 34.0–46.6)
Hemoglobin: 13 g/dL (ref 11.1–15.9)
Immature Grans (Abs): 0 x10E3/uL (ref 0.0–0.1)
Immature Granulocytes: 0 %
Lymphocytes Absolute: 1.5 x10E3/uL (ref 0.7–3.1)
Lymphs: 27 %
MCH: 28.3 pg (ref 26.6–33.0)
MCHC: 32 g/dL (ref 31.5–35.7)
MCV: 88 fL (ref 79–97)
Monocytes Absolute: 0.3 x10E3/uL (ref 0.1–0.9)
Monocytes: 5 %
Neutrophils Absolute: 3.7 x10E3/uL (ref 1.4–7.0)
Neutrophils: 65 %
Platelets: 255 x10E3/uL (ref 150–450)
RBC: 4.6 x10E6/uL (ref 3.77–5.28)
RDW: 13.1 % (ref 11.7–15.4)
WBC: 5.6 x10E3/uL (ref 3.4–10.8)

## 2024-05-19 LAB — LIPID PANEL
Chol/HDL Ratio: 4.2 ratio (ref 0.0–4.4)
Cholesterol, Total: 146 mg/dL (ref 100–199)
HDL: 35 mg/dL — ABNORMAL LOW (ref >39)
LDL Chol Calc (NIH): 79 mg/dL (ref 0–99)
Triglycerides: 191 mg/dL — ABNORMAL HIGH (ref 0–149)
VLDL Cholesterol Cal: 32 mg/dL (ref 5–40)

## 2024-05-19 LAB — CMP14+EGFR
ALT: 11 IU/L (ref 0–32)
AST: 13 IU/L (ref 0–40)
Albumin: 4.3 g/dL (ref 3.9–4.9)
Alkaline Phosphatase: 78 IU/L (ref 41–116)
BUN/Creatinine Ratio: 14 (ref 9–23)
BUN: 12 mg/dL (ref 6–20)
Bilirubin Total: 0.4 mg/dL (ref 0.0–1.2)
CO2: 23 mmol/L (ref 20–29)
Calcium: 9.3 mg/dL (ref 8.7–10.2)
Chloride: 104 mmol/L (ref 96–106)
Creatinine, Ser: 0.83 mg/dL (ref 0.57–1.00)
Globulin, Total: 2.5 g/dL (ref 1.5–4.5)
Glucose: 80 mg/dL (ref 70–99)
Potassium: 4.1 mmol/L (ref 3.5–5.2)
Sodium: 142 mmol/L (ref 134–144)
Total Protein: 6.8 g/dL (ref 6.0–8.5)
eGFR: 96 mL/min/1.73 (ref >59)

## 2024-05-19 LAB — HEMOGLOBIN A1C
Est. average glucose Bld gHb Est-mCnc: 97 mg/dL
Hgb A1c MFr Bld: 5 % (ref 4.8–5.6)

## 2024-05-19 MED ORDER — METRONIDAZOLE 500 MG PO TABS
500.0000 mg | ORAL_TABLET | Freq: Two times a day (BID) | ORAL | 0 refills | Status: AC
Start: 2024-05-19 — End: 2024-05-26

## 2024-06-21 ENCOUNTER — Telehealth: Payer: Self-pay

## 2024-06-21 NOTE — Telephone Encounter (Signed)
 Copied from CRM #[id number]. Topic: Clinical - Medication Question >> Jun 21, 2024  8:19 AM Burnard DEL wrote: Reason for CRM: Patient was prescribed metronidazole  for BV. Patient stated that she went and got a Yoni steam after taking the medication and a week later she started experiencing the same symptoms from when she had BV.She's having outer layer vaginal burning/tingle. She would like to know if she could be prescribed more of this medication for these symptoms.?   [id number] - [place], [place] - [address] AT [facility]  Phone: [phone] Fax: [phone]

## 2024-06-28 ENCOUNTER — Other Ambulatory Visit: Payer: Self-pay | Admitting: Family Medicine

## 2024-07-01 ENCOUNTER — Ambulatory Visit: Payer: Self-pay

## 2024-07-01 NOTE — Telephone Encounter (Signed)
 FYI Only or Action Required?: Action required by provider: requesting a prescription of Flagyl  with no current symptoms..  Patient was last seen in primary care on 05/18/2024 by Tanda Bleacher, MD.  Called Nurse Triage reporting Advice Only.  Triage Disposition: Call PCP Within 24 Hours  Patient/caregiver understands and will follow disposition?: Yes  Reason for Disposition  [1] Caller requests to speak ONLY to PCP AND [2] NON-URGENT question  Answer Assessment - Initial Assessment Questions 1. REASON FOR CALL or QUESTION: What is your reason for calling today? or How can I best     Patient is requesting a prescription for Flagyl . Patient denies any symptoms at this time. States she thought she had symptoms over a week ago but states she currently not having any symptoms today. Endorses she would like to have a prescription on hand. Patient is requesting a call back from the office.  2. CALLER: Document the source of call. (e.g., laboratory staff, caregiver or patient).     Patient.  Protocols used: PCP Call - No Triage-A-AH

## 2024-07-01 NOTE — Telephone Encounter (Signed)
 Voicemail left requesting call back.  Copied from CRM #[id number]. Topic: Clinical - Medication Question >> Jul 01, 2024 11:23 AM Shelia Martin wrote: Reason for CRM: Patient calling to check on the status update on her refill request for metroNIDAZOLE  (FLAGYL ) 500 MG tablet. States she has yeast infection symptoms and the last time she had the same thing the medication cleared it.  Patient can be reached at [phone]

## 2024-09-06 ENCOUNTER — Other Ambulatory Visit: Payer: Self-pay | Admitting: Family Medicine

## 2024-09-06 DIAGNOSIS — L500 Allergic urticaria: Secondary | ICD-10-CM
